# Patient Record
Sex: Male | Born: 1960 | Hispanic: No | Smoking: Never smoker
Health system: Southern US, Community
[De-identification: ages and names within clinical notes are randomized; demographics above are authoritative.]

## PROBLEM LIST (undated history)

## (undated) DIAGNOSIS — T7840XA Allergy, unspecified, initial encounter: Secondary | ICD-10-CM

## (undated) DIAGNOSIS — I10 Essential (primary) hypertension: Secondary | ICD-10-CM

## (undated) DIAGNOSIS — Z531 Procedure and treatment not carried out because of patient's decision for reasons of belief and group pressure: Secondary | ICD-10-CM

## (undated) DIAGNOSIS — IMO0001 Reserved for inherently not codable concepts without codable children: Secondary | ICD-10-CM

## (undated) HISTORY — PX: HERNIA REPAIR: SHX51

## (undated) HISTORY — DX: Allergy, unspecified, initial encounter: T78.40XA

---

## 2013-03-25 ENCOUNTER — Ambulatory Visit: Payer: Self-pay

## 2013-03-25 ENCOUNTER — Ambulatory Visit: Payer: Self-pay | Admitting: Internal Medicine

## 2013-03-25 VITALS — BP 207/139 | HR 101 | Temp 98.5°F | Resp 16 | Ht 65.5 in | Wt 174.0 lb

## 2013-03-25 DIAGNOSIS — M25519 Pain in unspecified shoulder: Secondary | ICD-10-CM

## 2013-03-25 DIAGNOSIS — M25512 Pain in left shoulder: Secondary | ICD-10-CM

## 2013-03-25 DIAGNOSIS — S40019A Contusion of unspecified shoulder, initial encounter: Secondary | ICD-10-CM

## 2013-03-25 DIAGNOSIS — S40012A Contusion of left shoulder, initial encounter: Secondary | ICD-10-CM

## 2013-03-25 DIAGNOSIS — I10 Essential (primary) hypertension: Secondary | ICD-10-CM

## 2013-03-25 DIAGNOSIS — S40029A Contusion of unspecified upper arm, initial encounter: Secondary | ICD-10-CM

## 2013-03-25 MED ORDER — AMLODIPINE BESYLATE 10 MG PO TABS: 10.0000 mg | ORAL_TABLET | Freq: Every day | ORAL | Status: AC
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
## 2013-03-25 NOTE — Patient Instructions (Addendum)
Shoulder Pain The shoulder is the joint that connects your arms to your body. The bones that form the shoulder joint include the upper arm bone (humerus), the shoulder blade (scapula), and the collarbone (clavicle). The top of the humerus is shaped like a ball and fits into a rather flat socket on the scapula (glenoid cavity). A combination of muscles and strong, fibrous tissues that connect muscles to bones (tendons) support your shoulder joint and hold the ball in the socket. Small, fluid-filled sacs (bursae) are located in different areas of the joint. They act as cushions between the bones and the overlying soft tissues and help reduce friction between the gliding tendons and the bone as you move your arm. Your shoulder joint allows a wide range of motion in your arm. This range of motion allows you to do things like scratch your back or throw a ball. However, this range of motion also makes your shoulder more prone to pain from overuse and injury. Causes of shoulder pain can originate from both injury and overuse and usually can be grouped in the following four categories:  Redness, swelling, and pain (inflammation) of the tendon (tendinitis) or the bursae (bursitis).  Instability, such as a dislocation of the joint.  Inflammation of the joint (arthritis).  Broken bone (fracture). HOME CARE INSTRUCTIONS   Apply ice to the sore area.  Put ice in a plastic bag.  Place a towel between your skin and the bag.  Leave the ice on for 15-20 minutes, 3-4 times per day for the first 2 days.  Stop using cold packs if they do not help with the pain.  If you have a shoulder sling or immobilizer, wear it as long as your caregiver instructs. Only remove it to shower or bathe. Move your arm as little as possible, but keep your hand moving to prevent swelling.  Squeeze a soft ball or foam pad as much as possible to help prevent swelling.  Only take over-the-counter or prescription medicines for pain,  discomfort, or fever as directed by your caregiver. SEEK MEDICAL CARE IF:   Your shoulder pain increases, or new pain develops in your arm, hand, or fingers.  Your hand or fingers become cold and numb.  Your pain is not relieved with medicines. SEEK IMMEDIATE MEDICAL CARE IF:   Your arm, hand, or fingers are numb or tingling.  Your arm, hand, or fingers are significantly swollen or turn white or blue. MAKE SURE YOU:   Understand these instructions.  Will watch your condition.  Will get help right away if you are not doing well or get worse. Document Released: 07/30/2005 Document Revised: 07/14/2012 Document Reviewed: 10/04/2011 [facility] Patient Information 2014 [place], [place]. Hypertension As your heart beats, it forces blood through your arteries. This force is your blood pressure. If the pressure is too high, it is called hypertension (HTN) or high blood pressure. HTN is dangerous because you may have it and not know it. High blood pressure may mean that your heart has to work harder to pump blood. Your arteries may be narrow or stiff. The extra work puts you at risk for heart disease, stroke, and other problems.  Blood pressure consists of two numbers, a higher number over a lower, 110/72, for example. It is stated as "110 over 72." The ideal is below 120 for the top number (systolic) and under 80 for the bottom (diastolic). Write down your blood pressure today. You should pay close attention to your blood pressure if you  have certain conditions such as:  Heart failure.  Prior heart attack.  Diabetes  Chronic kidney disease.  Prior stroke.  Multiple risk factors for heart disease. To see if you have HTN, your blood pressure should be measured while you are seated with your arm held at the level of the heart. It should be measured at least twice. A one-time elevated blood pressure reading (especially in the Emergency Department) does not mean that you need treatment. There  may be conditions in which the blood pressure is different between your right and left arms. It is important to see your caregiver soon for a recheck. Most people have essential hypertension which means that there is not a specific cause. This type of high blood pressure may be lowered by changing lifestyle factors such as:  Stress.  Smoking.  Lack of exercise.  Excessive weight.  Drug/tobacco/alcohol use.  Eating less salt. Most people do not have symptoms from high blood pressure until it has caused damage to the body. Effective treatment can often prevent, delay or reduce that damage. TREATMENT  When a cause has been identified, treatment for high blood pressure is directed at the cause. There are a large number of medications to treat HTN. These fall into several categories, and your caregiver will help you select the medicines that are best for you. Medications may have side effects. You should review side effects with your caregiver. If your blood pressure stays high after you have made lifestyle changes or started on medicines,   Your medication(s) may need to be changed.  Other problems may need to be addressed.  Be certain you understand your prescriptions, and know how and when to take your medicine.  Be sure to follow up with your caregiver within the time frame advised (usually within two weeks) to have your blood pressure rechecked and to review your medications.  If you are taking more than one medicine to lower your blood pressure, make sure you know how and at what times they should be taken. Taking two medicines at the same time can result in blood pressure that is too low. SEEK IMMEDIATE MEDICAL CARE IF:  You develop a severe headache, blurred or changing vision, or confusion.  You have unusual weakness or numbness, or a faint feeling.  You have severe chest or abdominal pain, vomiting, or breathing problems. MAKE SURE YOU:   Understand these  instructions.  Will watch your condition.  Will get help right away if you are not doing well or get worse. Document Released: 10/20/2005 Document Revised: 01/12/2012 Document Reviewed: 06/09/2008 [facility] Patient Information 2014 [place], [place]. DASH Diet The DASH diet stands for "Dietary Approaches to Stop Hypertension." It is a healthy eating plan that has been shown to reduce high blood pressure (hypertension) in as little as 14 days, while also possibly providing other significant health benefits. These other health benefits include reducing the risk of breast cancer after menopause and reducing the risk of type 2 diabetes, heart disease, colon cancer, and stroke. Health benefits also include weight loss and slowing kidney failure in patients with chronic kidney disease.  DIET GUIDELINES  Limit salt (sodium). Your diet should contain less than 1500 mg of sodium daily.  Limit refined or processed carbohydrates. Your diet should include mostly whole grains. Desserts and added sugars should be used sparingly.  Include small amounts of heart-healthy fats. These types of fats include nuts, oils, and tub margarine. Limit saturated and trans fats. These fats have been shown to  be harmful in the body. CHOOSING FOODS  The following food groups are based on a 2000 calorie diet. See your Registered Dietitian for individual calorie needs. Grains and Grain Products (6 to 8 servings daily)  Eat More Often: Whole-wheat bread, brown rice, whole-grain or wheat pasta, quinoa, popcorn without added fat or salt (air popped).  Eat Less Often: White bread, white pasta, white rice, cornbread. Vegetables (4 to 5 servings daily)  Eat More Often: Fresh, frozen, and canned vegetables. Vegetables may be raw, steamed, roasted, or grilled with a minimal amount of fat.  Eat Less Often/Avoid: Creamed or fried vegetables. Vegetables in a cheese sauce. Fruit (4 to 5 servings daily)  Eat More Often: All fresh,  canned (in natural juice), or frozen fruits. Dried fruits without added sugar. One hundred percent fruit juice ( cup [237 mL] daily).  Eat Less Often: Dried fruits with added sugar. Canned fruit in light or heavy syrup. [employer], Fish, and Poultry (2 servings or less daily. One serving is 3 to 4 oz [85-114 g]).  Eat More Often: Ninety percent or leaner ground beef, tenderloin, sirloin. Round cuts of beef, chicken breast, [place] breast. All fish. Grill, bake, or broil your meat. Nothing should be fried.  Eat Less Often/Avoid: Fatty cuts of meat, [place], or chicken leg, thigh, or wing. Fried cuts of meat or fish. Dairy (2 to 3 servings)  Eat More Often: Low-fat or fat-free milk, low-fat plain or light yogurt, reduced-fat or part-skim cheese.  Eat Less Often/Avoid: Milk (whole, 2%).Whole milk yogurt. Full-fat cheeses. Nuts, Seeds, and Legumes (4 to 5 servings per week)  Eat More Often: All without added salt.  Eat Less Often/Avoid: Salted nuts and seeds, canned beans with added salt. Fats and Sweets (limited)  Eat More Often: Vegetable oils, tub margarines without trans fats, sugar-free gelatin. Mayonnaise and salad dressings.  Eat Less Often/Avoid: Coconut oils, palm oils, butter, stick margarine, cream, half and half, cookies, candy, pie. FOR MORE INFORMATION The Dash Diet Eating Plan: www.dashdiet.org Document Released: 10/09/2011 Document Revised: 01/12/2012 Document Reviewed: 10/09/2011 [facility] Patient Information 2014 [place], [place].

## 2013-06-13 DIAGNOSIS — Z0271 Encounter for disability determination: Secondary | ICD-10-CM
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
## 2022-04-18 ENCOUNTER — Emergency Department (HOSPITAL_COMMUNITY): Payer: Self-pay

## 2022-04-18 ENCOUNTER — Inpatient Hospital Stay (HOSPITAL_COMMUNITY): Payer: Self-pay

## 2022-04-18 ENCOUNTER — Inpatient Hospital Stay: Payer: Self-pay

## 2022-04-18 ENCOUNTER — Encounter (HOSPITAL_COMMUNITY): Payer: Self-pay | Admitting: Neurology

## 2022-04-18 ENCOUNTER — Inpatient Hospital Stay (HOSPITAL_COMMUNITY)
Admission: EM | Admit: 2022-04-18 | Discharge: 2022-05-03 | DRG: 064 | Disposition: E | Payer: Self-pay | Attending: Neurology | Admitting: Neurology

## 2022-04-18 DIAGNOSIS — G8191 Hemiplegia, unspecified affecting right dominant side: Secondary | ICD-10-CM | POA: Diagnosis present

## 2022-04-18 DIAGNOSIS — R569 Unspecified convulsions: Secondary | ICD-10-CM

## 2022-04-18 DIAGNOSIS — J9601 Acute respiratory failure with hypoxia: Secondary | ICD-10-CM | POA: Diagnosis present

## 2022-04-18 DIAGNOSIS — I6389 Other cerebral infarction: Secondary | ICD-10-CM

## 2022-04-18 DIAGNOSIS — E785 Hyperlipidemia, unspecified: Secondary | ICD-10-CM | POA: Diagnosis present

## 2022-04-18 DIAGNOSIS — G9340 Encephalopathy, unspecified: Secondary | ICD-10-CM | POA: Diagnosis present

## 2022-04-18 DIAGNOSIS — G935 Compression of brain: Secondary | ICD-10-CM | POA: Diagnosis present

## 2022-04-18 DIAGNOSIS — I619 Nontraumatic intracerebral hemorrhage, unspecified: Secondary | ICD-10-CM | POA: Diagnosis present

## 2022-04-18 DIAGNOSIS — Z531 Procedure and treatment not carried out because of patient's decision for reasons of belief and group pressure: Secondary | ICD-10-CM | POA: Diagnosis present

## 2022-04-18 DIAGNOSIS — R29810 Facial weakness: Secondary | ICD-10-CM | POA: Diagnosis present

## 2022-04-18 DIAGNOSIS — H5347 Heteronymous bilateral field defects: Secondary | ICD-10-CM | POA: Diagnosis present

## 2022-04-18 DIAGNOSIS — I161 Hypertensive emergency: Secondary | ICD-10-CM | POA: Diagnosis present

## 2022-04-18 DIAGNOSIS — Z515 Encounter for palliative care: Secondary | ICD-10-CM

## 2022-04-18 DIAGNOSIS — Z66 Do not resuscitate: Secondary | ICD-10-CM | POA: Diagnosis not present

## 2022-04-18 DIAGNOSIS — N179 Acute kidney failure, unspecified: Secondary | ICD-10-CM | POA: Diagnosis not present

## 2022-04-18 DIAGNOSIS — Z9911 Dependence on respirator [ventilator] status: Secondary | ICD-10-CM

## 2022-04-18 DIAGNOSIS — R4701 Aphasia: Secondary | ICD-10-CM | POA: Diagnosis present

## 2022-04-18 DIAGNOSIS — G936 Cerebral edema: Secondary | ICD-10-CM | POA: Diagnosis present

## 2022-04-18 DIAGNOSIS — I10 Essential (primary) hypertension: Secondary | ICD-10-CM | POA: Diagnosis present

## 2022-04-18 DIAGNOSIS — R414 Neurologic neglect syndrome: Secondary | ICD-10-CM | POA: Diagnosis present

## 2022-04-18 DIAGNOSIS — Z8673 Personal history of transient ischemic attack (TIA), and cerebral infarction without residual deficits: Secondary | ICD-10-CM

## 2022-04-18 DIAGNOSIS — R29722 NIHSS score 22: Secondary | ICD-10-CM | POA: Diagnosis present

## 2022-04-18 DIAGNOSIS — I610 Nontraumatic intracerebral hemorrhage in hemisphere, subcortical: Secondary | ICD-10-CM | POA: Diagnosis present

## 2022-04-18 DIAGNOSIS — I615 Nontraumatic intracerebral hemorrhage, intraventricular: Principal | ICD-10-CM | POA: Diagnosis present

## 2022-04-18 DIAGNOSIS — I629 Nontraumatic intracranial hemorrhage, unspecified: Secondary | ICD-10-CM

## 2022-04-18 HISTORY — DX: Procedure and treatment not carried out because of patient's decision for reasons of belief and group pressure: Z53.1

## 2022-04-18 HISTORY — DX: Essential (primary) hypertension: I10

## 2022-04-18 HISTORY — DX: Reserved for inherently not codable concepts without codable children: IMO0001

## 2022-04-18 LAB — LIPID PANEL
Cholesterol: 201 mg/dL — ABNORMAL HIGH (ref 0–200)
HDL: 63 mg/dL (ref >40)
LDL Cholesterol: 112 mg/dL — ABNORMAL HIGH (ref 0–99)
Total CHOL/HDL Ratio: 3.2 RATIO
Triglycerides: 130 mg/dL (ref <150)
VLDL: 26 mg/dL (ref 0–40)

## 2022-04-18 LAB — DIFFERENTIAL
Abs Immature Granulocytes: 0.03 10*3/uL (ref 0.00–0.07)
Basophils Absolute: 0 10*3/uL (ref 0.0–0.1)
Basophils Relative: 0 %
Eosinophils Absolute: 0 10*3/uL (ref 0.0–0.5)
Eosinophils Relative: 0 %
Immature Granulocytes: 0 %
Lymphocytes Relative: 10 %
Lymphs Abs: 1.1 10*3/uL (ref 0.7–4.0)
Monocytes Absolute: 0.5 10*3/uL (ref 0.1–1.0)
Monocytes Relative: 5 %
Neutro Abs: 8.8 10*3/uL — ABNORMAL HIGH (ref 1.7–7.7)
Neutrophils Relative %: 85 %

## 2022-04-18 LAB — CBC
HCT: 53.2 % — ABNORMAL HIGH (ref 39.0–52.0)
Hemoglobin: 16.5 g/dL (ref 13.0–17.0)
MCH: 22.9 pg — ABNORMAL LOW (ref 26.0–34.0)
MCHC: 31 g/dL (ref 30.0–36.0)
MCV: 73.7 fL — ABNORMAL LOW (ref 80.0–100.0)
Platelets: 147 10*3/uL — ABNORMAL LOW (ref 150–400)
RBC: 7.22 MIL/uL — ABNORMAL HIGH (ref 4.22–5.81)
RDW: 16.9 % — ABNORMAL HIGH (ref 11.5–15.5)
WBC: 10.5 10*3/uL (ref 4.0–10.5)
nRBC: 0 % (ref 0.0–0.2)

## 2022-04-18 LAB — GLUCOSE, CAPILLARY
Glucose-Capillary: 132 mg/dL — ABNORMAL HIGH (ref 70–99)
Glucose-Capillary: 141 mg/dL — ABNORMAL HIGH (ref 70–99)
Glucose-Capillary: 182 mg/dL — ABNORMAL HIGH (ref 70–99)

## 2022-04-18 LAB — CBG MONITORING, ED: Glucose-Capillary: 138 mg/dL — ABNORMAL HIGH (ref 70–99)

## 2022-04-18 LAB — ECHOCARDIOGRAM COMPLETE
Area-P 1/2: 3.27 cm2
Height: 66 in
MV VTI: 3.18 cm2
P 1/2 time: 576 msec
S' Lateral: 2.5 cm
Single Plane A4C EF: 61.4 %
Weight: 2970.04 oz

## 2022-04-18 LAB — COMPREHENSIVE METABOLIC PANEL
ALT: 23 U/L (ref 0–44)
AST: 24 U/L (ref 15–41)
Albumin: 4.1 g/dL (ref 3.5–5.0)
Alkaline Phosphatase: 86 U/L (ref 38–126)
Anion gap: 16 — ABNORMAL HIGH (ref 5–15)
BUN: 12 mg/dL (ref 8–23)
CO2: 19 mmol/L — ABNORMAL LOW (ref 22–32)
Calcium: 9.5 mg/dL (ref 8.9–10.3)
Chloride: 103 mmol/L (ref 98–111)
Creatinine, Ser: 1.2 mg/dL (ref 0.61–1.24)
GFR, Estimated: >60 mL/min (ref >60)
Glucose, Bld: 130 mg/dL — ABNORMAL HIGH (ref 70–99)
Potassium: 3.5 mmol/L (ref 3.5–5.1)
Sodium: 138 mmol/L (ref 135–145)
Total Bilirubin: 0.9 mg/dL (ref 0.3–1.2)
Total Protein: 7.9 g/dL (ref 6.5–8.1)

## 2022-04-18 LAB — POCT I-STAT 7, (LYTES, BLD GAS, ICA,H+H)
Acid-Base Excess: 0 mmol/L (ref 0.0–2.0)
Bicarbonate: 25.7 mmol/L (ref 20.0–28.0)
Calcium, Ion: 1.23 mmol/L (ref 1.15–1.40)
HCT: 52 % (ref 39.0–52.0)
Hemoglobin: 17.7 g/dL — ABNORMAL HIGH (ref 13.0–17.0)
O2 Saturation: 100 %
Potassium: 3.6 mmol/L (ref 3.5–5.1)
Sodium: 142 mmol/L (ref 135–145)
TCO2: 27 mmol/L (ref 22–32)
pCO2 arterial: 46.2 mmHg (ref 32–48)
pH, Arterial: 7.353 (ref 7.35–7.45)
pO2, Arterial: 365 mmHg — ABNORMAL HIGH (ref 83–108)

## 2022-04-18 LAB — I-STAT CHEM 8, ED
BUN: 14 mg/dL (ref 8–23)
Calcium, Ion: 1.01 mmol/L — ABNORMAL LOW (ref 1.15–1.40)
Chloride: 103 mmol/L (ref 98–111)
Creatinine, Ser: 1 mg/dL (ref 0.61–1.24)
Glucose, Bld: 128 mg/dL — ABNORMAL HIGH (ref 70–99)
HCT: 55 % — ABNORMAL HIGH (ref 39.0–52.0)
Hemoglobin: 18.7 g/dL — ABNORMAL HIGH (ref 13.0–17.0)
Potassium: 3.4 mmol/L — ABNORMAL LOW (ref 3.5–5.1)
Sodium: 138 mmol/L (ref 135–145)
TCO2: 21 mmol/L — ABNORMAL LOW (ref 22–32)

## 2022-04-18 LAB — TROPONIN I (HIGH SENSITIVITY): Troponin I (High Sensitivity): 36 ng/L — ABNORMAL HIGH (ref <18)

## 2022-04-18 LAB — PROTIME-INR
INR: 1 (ref 0.8–1.2)
Prothrombin Time: 13.4 seconds (ref 11.4–15.2)

## 2022-04-18 LAB — SODIUM
Sodium: 140 mmol/L (ref 135–145)
Sodium: 141 mmol/L (ref 135–145)

## 2022-04-18 LAB — ETHANOL: Alcohol, Ethyl (B): <10 mg/dL (ref <10)

## 2022-04-18 LAB — MRSA NEXT GEN BY PCR, NASAL: MRSA by PCR Next Gen: NOT DETECTED

## 2022-04-18 LAB — APTT: aPTT: 26 seconds (ref 24–36)

## 2022-04-18 LAB — HEMOGLOBIN A1C
Hgb A1c MFr Bld: 5.8 % — ABNORMAL HIGH (ref 4.8–5.6)
Mean Plasma Glucose: 119.76 mg/dL

## 2022-04-18 LAB — LACTIC ACID, PLASMA: Lactic Acid, Venous: 1.3 mmol/L (ref 0.5–1.9)

## 2022-04-18 LAB — HIV ANTIBODY (ROUTINE TESTING W REFLEX): HIV Screen 4th Generation wRfx: NONREACTIVE

## 2022-04-18 MED ORDER — HYDRALAZINE HCL 20 MG/ML IJ SOLN: 10.0000 mg | INTRAMUSCULAR | Status: DC | PRN

## 2022-04-18 MED ORDER — MIDAZOLAM HCL 2 MG/2ML IJ SOLN: 2.0000 mg | Freq: Once | INTRAMUSCULAR | Status: AC

## 2022-04-18 MED ORDER — STROKE: EARLY STAGES OF RECOVERY BOOK
Freq: Once | Status: AC
  Filled 2022-04-18: qty 1

## 2022-04-18 MED ORDER — DOCUSATE SODIUM 50 MG/5ML PO LIQD
100.0000 mg | Freq: Two times a day (BID) | ORAL | Status: DC
  Administered 2022-04-18 – 2022-04-21 (×7): 100 mg
  Filled 2022-04-18 (×7): qty 10

## 2022-04-18 MED ORDER — CLEVIDIPINE BUTYRATE 0.5 MG/ML IV EMUL
INTRAVENOUS | Status: AC
  Administered 2022-04-18: 2 mg/h via INTRAVENOUS
  Filled 2022-04-18: qty 50

## 2022-04-18 MED ORDER — FENTANYL CITRATE PF 50 MCG/ML IJ SOSY
50.0000 ug | PREFILLED_SYRINGE | INTRAMUSCULAR | Status: DC | PRN
  Administered 2022-04-18: 100 ug via INTRAVENOUS
  Administered 2022-04-18 – 2022-04-19 (×2): 50 ug via INTRAVENOUS
  Administered 2022-04-19 (×2): 100 ug via INTRAVENOUS
  Filled 2022-04-18: qty 1
  Filled 2022-04-18 (×3): qty 2
  Filled 2022-04-18: qty 1
  Filled 2022-04-18: qty 2
  Filled 2022-04-18: qty 1

## 2022-04-18 MED ORDER — CLEVIDIPINE BUTYRATE 0.5 MG/ML IV EMUL
0.0000 mg/h | INTRAVENOUS | Status: DC
  Administered 2022-04-18: 21 mg/h via INTRAVENOUS
  Filled 2022-04-18 (×3): qty 50

## 2022-04-18 MED ORDER — ACETAMINOPHEN 650 MG RE SUPP: 650.0000 mg | RECTAL | Status: DC | PRN

## 2022-04-18 MED ORDER — PANTOPRAZOLE SODIUM 40 MG IV SOLR: 40.0000 mg | Freq: Every day | INTRAVENOUS | Status: DC

## 2022-04-18 MED ORDER — SODIUM CHLORIDE 0.9% FLUSH
10.0000 mL | Freq: Two times a day (BID) | INTRAVENOUS | Status: DC
  Administered 2022-04-19 (×2): 10 mL
  Administered 2022-04-19: 30 mL
  Administered 2022-04-20 – 2022-04-21 (×3): 10 mL

## 2022-04-18 MED ORDER — PROSOURCE TF PO LIQD
45.0000 mL | Freq: Three times a day (TID) | ORAL | Status: DC
  Administered 2022-04-19 – 2022-04-21 (×8): 45 mL
  Filled 2022-04-18 (×8): qty 45

## 2022-04-18 MED ORDER — HYDRALAZINE HCL 25 MG PO TABS
25.0000 mg | ORAL_TABLET | Freq: Four times a day (QID) | ORAL | Status: DC
Start: 2022-04-18 — End: 2022-04-19
  Administered 2022-04-18 – 2022-04-19 (×5): 25 mg
  Filled 2022-04-18 (×5): qty 1

## 2022-04-18 MED ORDER — AMLODIPINE BESYLATE 10 MG PO TABS
10.0000 mg | ORAL_TABLET | Freq: Every day | ORAL | Status: DC
Start: 2022-04-18 — End: 2022-04-21
  Administered 2022-04-18 – 2022-04-21 (×4): 10 mg
  Filled 2022-04-18 (×4): qty 1

## 2022-04-18 MED ORDER — FENTANYL CITRATE PF 50 MCG/ML IJ SOSY
50.0000 ug | PREFILLED_SYRINGE | INTRAMUSCULAR | Status: AC | PRN
  Administered 2022-04-18 (×3): 50 ug via INTRAVENOUS
  Filled 2022-04-18 (×3): qty 1

## 2022-04-18 MED ORDER — FENTANYL CITRATE PF 50 MCG/ML IJ SOSY
100.0000 ug | PREFILLED_SYRINGE | Freq: Once | INTRAMUSCULAR | Status: AC
  Administered 2022-04-18: 100 ug via INTRAVENOUS
  Filled 2022-04-18: qty 2

## 2022-04-18 MED ORDER — PROPOFOL 1000 MG/100ML IV EMUL
INTRAVENOUS | Status: AC
  Filled 2022-04-18: qty 100

## 2022-04-18 MED ORDER — CHLORHEXIDINE GLUCONATE CLOTH 2 % EX PADS
6.0000 | MEDICATED_PAD | Freq: Every day | CUTANEOUS | Status: DC
  Administered 2022-04-18 – 2022-04-21 (×3): 6 via TOPICAL

## 2022-04-18 MED ORDER — MIDAZOLAM HCL 2 MG/2ML IJ SOLN
2.0000 mg | INTRAMUSCULAR | Status: DC | PRN
  Administered 2022-04-19 – 2022-04-20 (×2): 2 mg via INTRAVENOUS
  Filled 2022-04-18 (×2): qty 2

## 2022-04-18 MED ORDER — PROPOFOL 1000 MG/100ML IV EMUL
0.0000 ug/kg/min | INTRAVENOUS | Status: DC
  Administered 2022-04-18: 30 ug/kg/min via INTRAVENOUS
  Administered 2022-04-18: 10 ug/kg/min via INTRAVENOUS
  Administered 2022-04-19 (×2): 50 ug/kg/min via INTRAVENOUS
  Administered 2022-04-19: 40 ug/kg/min via INTRAVENOUS
  Administered 2022-04-19 – 2022-04-20 (×4): 50 ug/kg/min via INTRAVENOUS
  Administered 2022-04-20: 10 ug/kg/min via INTRAVENOUS
  Administered 2022-04-20 – 2022-04-21 (×4): 50 ug/kg/min via INTRAVENOUS
  Filled 2022-04-18 (×14): qty 100

## 2022-04-18 MED ORDER — SODIUM CHLORIDE 3 % IV BOLUS
250.0000 mL | Freq: Once | INTRAVENOUS | Status: AC
Start: 2022-04-18 — End: 2022-04-18
  Administered 2022-04-18: 250 mL via INTRAVENOUS
  Filled 2022-04-18: qty 500

## 2022-04-18 MED ORDER — CLEVIDIPINE BUTYRATE 0.5 MG/ML IV EMUL
0.0000 mg/h | INTRAVENOUS | Status: DC
  Administered 2022-04-18: 21 mg/h via INTRAVENOUS
  Administered 2022-04-18: 19 mg/h via INTRAVENOUS
  Administered 2022-04-18: 14 mg/h via INTRAVENOUS
  Administered 2022-04-18 (×2): 12 mg/h via INTRAVENOUS
  Administered 2022-04-19: 14 mg/h via INTRAVENOUS
  Administered 2022-04-19: 15 mg/h via INTRAVENOUS
  Administered 2022-04-19: 14 mg/h via INTRAVENOUS
  Administered 2022-04-19: 16 mg/h via INTRAVENOUS
  Administered 2022-04-20 (×2): 21 mg/h via INTRAVENOUS
  Administered 2022-04-20: 20 mg/h via INTRAVENOUS
  Administered 2022-04-20: 15 mg/h via INTRAVENOUS
  Administered 2022-04-20: 20 mg/h via INTRAVENOUS
  Administered 2022-04-20: 21 mg/h via INTRAVENOUS
  Administered 2022-04-20: 18 mg/h via INTRAVENOUS
  Administered 2022-04-21 (×2): 21 mg/h via INTRAVENOUS
  Administered 2022-04-21: 17 mg/h via INTRAVENOUS
  Filled 2022-04-18 (×4): qty 100
  Filled 2022-04-18: qty 200
  Filled 2022-04-18 (×2): qty 100
  Filled 2022-04-18: qty 200
  Filled 2022-04-18 (×12): qty 100

## 2022-04-18 MED ORDER — LEVETIRACETAM IN NACL 1000 MG/100ML IV SOLN
1000.0000 mg | Freq: Once | INTRAVENOUS | Status: AC
  Administered 2022-04-18: 1000 mg via INTRAVENOUS
  Filled 2022-04-18: qty 100

## 2022-04-18 MED ORDER — ACETAMINOPHEN 325 MG PO TABS: 650.0000 mg | ORAL_TABLET | ORAL | Status: DC | PRN

## 2022-04-18 MED ORDER — FENTANYL CITRATE PF 50 MCG/ML IJ SOSY
PREFILLED_SYRINGE | INTRAMUSCULAR | Status: AC
  Filled 2022-04-18: qty 2

## 2022-04-18 MED ORDER — MIDAZOLAM HCL 2 MG/2ML IJ SOLN
INTRAMUSCULAR | Status: AC
  Administered 2022-04-18: 2 mg via INTRAVENOUS
  Filled 2022-04-18: qty 4

## 2022-04-18 MED ORDER — SODIUM CHLORIDE 0.9% FLUSH: 10.0000 mL | INTRAVENOUS | Status: DC | PRN

## 2022-04-18 MED ORDER — LEVETIRACETAM IN NACL 500 MG/100ML IV SOLN
500.0000 mg | Freq: Two times a day (BID) | INTRAVENOUS | Status: DC
  Administered 2022-04-19 – 2022-04-21 (×5): 500 mg via INTRAVENOUS
  Filled 2022-04-18 (×5): qty 100

## 2022-04-18 MED ORDER — ORAL CARE MOUTH RINSE
15.0000 mL | OROMUCOSAL | Status: DC
  Administered 2022-04-18 – 2022-04-21 (×33): 15 mL via OROMUCOSAL

## 2022-04-18 MED ORDER — ROCURONIUM BROMIDE 50 MG/5ML IV SOLN
100.0000 mg | Freq: Once | INTRAVENOUS | Status: AC
  Filled 2022-04-18: qty 10

## 2022-04-18 MED ORDER — CALCIUM GLUCONATE-NACL 1-0.675 GM/50ML-% IV SOLN
1.0000 g | Freq: Once | INTRAVENOUS | Status: AC
  Administered 2022-04-18: 1000 mg via INTRAVENOUS
  Filled 2022-04-18: qty 50

## 2022-04-18 MED ORDER — LABETALOL HCL 5 MG/ML IV SOLN
10.0000 mg | INTRAVENOUS | Status: DC | PRN
  Administered 2022-04-18 – 2022-04-19 (×2): 10 mg via INTRAVENOUS
  Filled 2022-04-18 (×2): qty 4

## 2022-04-18 MED ORDER — LABETALOL HCL 5 MG/ML IV SOLN
20.0000 mg | Freq: Once | INTRAVENOUS | Status: AC
  Administered 2022-04-18: 20 mg via INTRAVENOUS

## 2022-04-18 MED ORDER — POTASSIUM CHLORIDE 20 MEQ PO PACK
40.0000 meq | PACK | Freq: Once | ORAL | Status: AC
Start: 2022-04-18 — End: 2022-04-18
  Administered 2022-04-18: 40 meq
  Filled 2022-04-18: qty 2

## 2022-04-18 MED ORDER — ROCURONIUM BROMIDE 10 MG/ML (PF) SYRINGE
PREFILLED_SYRINGE | INTRAVENOUS | Status: AC
  Administered 2022-04-18: 100 mg via INTRAVENOUS
  Filled 2022-04-18: qty 10

## 2022-04-18 MED ORDER — SODIUM CHLORIDE 3 % IV SOLN
INTRAVENOUS | Status: DC
  Administered 2022-04-18 (×2): 75 mL/h via INTRAVENOUS
  Filled 2022-04-18 (×7): qty 500

## 2022-04-18 MED ORDER — PANTOPRAZOLE 2 MG/ML SUSPENSION
40.0000 mg | Freq: Every day | ORAL | Status: DC
Start: 2022-04-18 — End: 2022-04-21
  Administered 2022-04-18 – 2022-04-21 (×4): 40 mg
  Filled 2022-04-18 (×4): qty 20

## 2022-04-18 MED ORDER — ORAL CARE MOUTH RINSE
15.0000 mL | OROMUCOSAL | Status: DC | PRN
Start: 2022-04-18 — End: 2022-04-21

## 2022-04-18 MED ORDER — PROSOURCE TF PO LIQD
45.0000 mL | Freq: Two times a day (BID) | ORAL | Status: DC
  Administered 2022-04-18: 45 mL
  Filled 2022-04-18: qty 45

## 2022-04-18 MED ORDER — SENNOSIDES-DOCUSATE SODIUM 8.6-50 MG PO TABS
1.0000 | ORAL_TABLET | Freq: Two times a day (BID) | ORAL | Status: DC
  Administered 2022-04-19: 1 via ORAL
  Filled 2022-04-18 (×2): qty 1

## 2022-04-18 MED ORDER — POLYETHYLENE GLYCOL 3350 17 G PO PACK
17.0000 g | PACK | Freq: Every day | ORAL | Status: DC
  Administered 2022-04-18 – 2022-04-21 (×4): 17 g
  Filled 2022-04-18 (×4): qty 1

## 2022-04-18 MED ORDER — ACETAMINOPHEN 160 MG/5ML PO SOLN
650.0000 mg | ORAL | Status: DC | PRN
  Administered 2022-04-20 – 2022-04-21 (×6): 650 mg
  Filled 2022-04-18 (×6): qty 20.3

## 2022-04-18 MED ORDER — INSULIN ASPART 100 UNIT/ML IJ SOLN
1.0000 [IU] | INTRAMUSCULAR | Status: DC
  Administered 2022-04-18 (×2): 1 [IU] via SUBCUTANEOUS
  Administered 2022-04-19 (×3): 2 [IU] via SUBCUTANEOUS
  Administered 2022-04-19: 1 [IU] via SUBCUTANEOUS
  Administered 2022-04-19 – 2022-04-20 (×3): 2 [IU] via SUBCUTANEOUS
  Administered 2022-04-20 (×2): 1 [IU] via SUBCUTANEOUS
  Administered 2022-04-20 – 2022-04-21 (×4): 2 [IU] via SUBCUTANEOUS
  Administered 2022-04-21: 1 [IU] via SUBCUTANEOUS
  Administered 2022-04-21 (×2): 2 [IU] via SUBCUTANEOUS

## 2022-04-18 MED ORDER — VITAL HIGH PROTEIN PO LIQD: 1000.0000 mL | ORAL | Status: DC

## 2022-04-18 MED ORDER — OSMOLITE 1.5 CAL PO LIQD
1000.0000 mL | ORAL | Status: DC
  Administered 2022-04-18 – 2022-04-21 (×4): 1000 mL

## 2022-04-18 MED ORDER — FENTANYL CITRATE PF 50 MCG/ML IJ SOSY
200.0000 ug | PREFILLED_SYRINGE | Freq: Once | INTRAMUSCULAR | Status: AC
  Administered 2022-04-18: 200 ug via INTRAVENOUS

## 2022-04-18 MED ORDER — IOHEXOL 350 MG/ML SOLN
60.0000 mL | Freq: Once | INTRAVENOUS | Status: AC | PRN
  Administered 2022-04-18: 60 mL via INTRAVENOUS

## 2022-04-18 NOTE — Progress Notes (Addendum)
Patient is starting LTM spoke with Dr. Thomasena Edis will hold off on MRI and CT until 6/17

## 2022-04-18 NOTE — Procedures (Signed)
Patient Name: Aaron Fitzgerald  MRN: [id number]  Epilepsy Attending: Charlsie Quest  Referring Physician/Provider: Elmer Picker, NP  Date: 04/25/2022 Duration: 24.54 mins  Patient history:  61 y.o. 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 EEG to evaluate for seizure  Level of alertness: lethargic   AEDs during EEG study: LEV, propofol  Technical aspects: This EEG study was done with scalp electrodes positioned according to the 10-20 International system of electrode placement. Electrical activity was acquired at a sampling rate of 500Hz  and reviewed with a high frequency filter of 70Hz  and a low frequency filter of 1Hz . EEG data were recorded continuously and digitally stored.   Description: EEG showed continuous generalized 3 to 6 Hz theta-delta slowing admixed with an excessive amount of 15 to 18 Hz beta activity distributed symmetrically and diffusely. Hyperventilation and photic stimulation were not performed.     ABNORMALITY - Continuous slow, generalized  IMPRESSION: This study is suggestive of moderate to severe diffuse encephalopathy, nonspecific etiology but likely related to sedation. No seizures or epileptiform discharges were seen throughout the recording.  [name] 

## 2022-04-18 NOTE — Procedures (Signed)
Arterial Catheter Insertion Procedure Note  Aaron Fitzgerald  [id number]  Nov 12, 1960  Date:05/13/2022  Time:3:53 PM    Provider Performing: Tobey Grim    Procedure: Insertion of Arterial Line (35361) with US guidance (44315)   Indication(s) Blood pressure monitoring and/or need for frequent ABGs  Consent Unable to obtain consent due to inability to find a medical decision maker for patient.  All reasonable efforts were made.  Another independent medical provider, Dr. Chestine Spore , confirmed the benefits of this procedure outweigh the risks.  Anesthesia None   Time Out Verified patient identification, verified procedure, site/side was marked, verified correct patient position, special equipment/implants available, medications/allergies/relevant history reviewed, required imaging and test results available.   Sterile Technique Maximal sterile technique including full sterile barrier drape, hand hygiene, sterile gown, sterile gloves, mask, hair covering, sterile ultrasound probe cover (if used).   Procedure Description Area of catheter insertion was cleaned with chlorhexidine and draped in sterile fashion. With real-time ultrasound guidance an arterial catheter was placed into the right radial artery.  Appropriate arterial tracings confirmed on monitor.     Complications/Tolerance None; patient tolerated the procedure well.   EBL Minimal   Specimen(s) None

## 2022-04-18 NOTE — Progress Notes (Signed)
EEG LTM hooked up after STAT routine. Patient had no skin breakdown. They are NOT MRI compatible leads. [facility] is monitoring.

## 2022-04-18 NOTE — Progress Notes (Signed)
Consent for PICC signed by Aaron Fitzgerald.

## 2022-04-18 NOTE — Progress Notes (Signed)
Patient seen and examined at bedside. Intermittently more alert, mostly gurgling oral secretions. Had apneas in the ED. Neglect R side, facial twitching on the left. Dense RUE paresis, moving RLE some but mostly with left leg. Briskly moving spontaneously on the left.  Friends at bedside who do not know much about his medical history. He had his healthcare directives in his wallet--photo below. They have confirmed he is Jehova's witness and does not want whole blood or the 4 main blood components, but are unsure about other things like albumin.  His primary POA is unavailable but the surrogate person is on the way from [facility]. Discussed plan to proceed with intubation for airway protection. On hypertonic saline. Starting keppra & EEG ordered. NS consulted. Neurology remains at bedside.       Steffanie Dunn, DO 04/27/2022 1:24 PM [facility]

## 2022-04-18 NOTE — Consult Note (Signed)
Reason for Consult: Cerebral hemorrhage Referring Physician: Stroke team  Aaron Fitzgerald is an 61 y.o. male.  HPI: Patient is a 61 year old male who apparently was found in his vehicle this morning.  He is noted to be nonverbal and confused.  He was brought to the emergency room where a CT scan of the head demonstrates presence of a left basal ganglia hemorrhage.  He initially was stable but hemiplegic on the right he has been nonverbal.  His level of consciousness has deteriorated and the patient is having a difficult time maintaining his airway he is about to be intubated for airway control.  I was consulted by the stroke team for evaluation of this process.  The patient's CT scan shows that he has some moderate shift the hemorrhage itself measures 45 mm in maximum dimension there is no ventricular penetration.  The ventricles are small at this time.  Past Medical History:  Diagnosis Date   Allergy    HTN (hypertension)    Refusal of blood transfusions as patient is Jehovah's Witness     Past Surgical History:  Procedure Laterality Date   HERNIA REPAIR      No family history on file.  Social History:  reports that he has never smoked. He does not have any smokeless tobacco history on file. He reports current alcohol use. He reports that he does not use drugs.  Allergies:  Allergies  Allergen Reactions   Plasma, Human     Jehova's witness   Red Blood Cells     Jehova's witness   Whole Blood     Jehova's witness    Medications: I have reviewed the patient's current medications.  Results for orders placed or performed during the hospital encounter of 04/06/2022 (from the past 48 hour(s))  Ethanol     Status: None   Collection Time: 04/08/2022 10:40 AM  Result Value Ref Range   Alcohol, Ethyl (B) <10 <10 mg/dL    Comment: (NOTE) Lowest detectable limit for serum alcohol is 10 mg/dL.  For medical purposes only. Performed at [facility], [address] [place],  [place] [address]   Protime-INR     Status: None   Collection Time: 04/23/2022 10:40 AM  Result Value Ref Range   Prothrombin Time 13.4 11.4 - 15.2 seconds   INR 1.0 0.8 - 1.2    Comment: (NOTE) INR goal varies based on device and disease states. Performed at [facility], [address] [place], [place] [address]   APTT     Status: None   Collection Time: 04/07/2022 10:40 AM  Result Value Ref Range   aPTT 26 24 - 36 seconds    Comment: Performed at [facility], [address], [place], [place] [address]  CBC     Status: Abnormal   Collection Time: 04/12/2022 10:40 AM  Result Value Ref Range   WBC 10.5 4.0 - 10.5 K/uL   RBC 7.22 (H) 4.22 - 5.81 MIL/uL   Hemoglobin 16.5 13.0 - 17.0 g/dL   HCT 53.6 (H) 14.4 - 31.5 %   MCV 73.7 (L) 80.0 - 100.0 fL   MCH 22.9 (L) 26.0 - 34.0 pg   MCHC 31.0 30.0 - 36.0 g/dL   RDW 40.0 (H) 86.7 - 61.9 %   Platelets 147 (L) 150 - 400 K/uL    Comment: REPEATED TO VERIFY   nRBC 0.0 0.0 - 0.2 %    Comment: Performed at [facility], [address] [place], [place] [address]  Differential     Status: Abnormal   Collection Time: 04/29/2022 10:40 AM  Result Value Ref Range   Neutrophils Relative % 85 %   Neutro Abs 8.8 (H) 1.7 - 7.7 K/uL   Lymphocytes Relative 10 %   Lymphs Abs 1.1 0.7 - 4.0 K/uL   Monocytes Relative 5 %   Monocytes Absolute 0.5 0.1 - 1.0 K/uL   Eosinophils Relative 0 %   Eosinophils Absolute 0.0 0.0 - 0.5 K/uL   Basophils Relative 0 %   Basophils Absolute 0.0 0.0 - 0.1 K/uL   Immature Granulocytes 0 %   Abs Immature Granulocytes 0.03 0.00 - 0.07 K/uL    Comment: Performed at [facility], [address] [place], [place] [address]  Comprehensive metabolic panel     Status: Abnormal   Collection Time: 04/12/2022 10:40 AM  Result Value Ref Range   Sodium 138 135 - 145 mmol/L   Potassium 3.5 3.5 - 5.1 mmol/L   Chloride 103 98 - 111 mmol/L   CO2 19 (L) 22 - 32 mmol/L   Glucose, Bld 130 (H) 70 - 99 mg/dL    Comment:  Glucose reference range applies only to samples taken after fasting for at least 8 hours.   BUN 12 8 - 23 mg/dL   Creatinine, Ser 4.03 0.61 - 1.24 mg/dL   Calcium 9.5 8.9 - 52.4 mg/dL   Total Protein 7.9 6.5 - 8.1 g/dL   Albumin 4.1 3.5 - 5.0 g/dL   AST 24 15 - 41 U/L   ALT 23 0 - 44 U/L   Alkaline Phosphatase 86 38 - 126 U/L   Total Bilirubin 0.9 0.3 - 1.2 mg/dL   GFR, Estimated >81 >85 mL/min    Comment: (NOTE) Calculated using the CKD-EPI Creatinine Equation (2021)    Anion gap 16 (H) 5 - 15    Comment: Performed at [facility], [address], [place], [place] [address]  CBG monitoring, ED     Status: Abnormal   Collection Time: 05/01/2022 10:42 AM  Result Value Ref Range   Glucose-Capillary 138 (H) 70 - 99 mg/dL    Comment: Glucose reference range applies only to samples taken after fasting for at least 8 hours.  I-stat chem 8, ED     Status: Abnormal   Collection Time: 05/01/2022 10:47 AM  Result Value Ref Range   Sodium 138 135 - 145 mmol/L   Potassium 3.4 (L) 3.5 - 5.1 mmol/L   Chloride 103 98 - 111 mmol/L   BUN 14 8 - 23 mg/dL   Creatinine, Ser 1.21 0.61 - 1.24 mg/dL   Glucose, Bld 624 (H) 70 - 99 mg/dL    Comment: Glucose reference range applies only to samples taken after fasting for at least 8 hours.   Calcium, Ion 1.01 (L) 1.15 - 1.40 mmol/L   TCO2 21 (L) 22 - 32 mmol/L   Hemoglobin 18.7 (H) 13.0 - 17.0 g/dL   HCT 46.9 (H) 50.7 - 22.5 %    CT ANGIO HEAD NECK W WO CM W PERF (CODE STROKE)  Result Date: 05/01/2022 CLINICAL DATA:  Neuro deficit, acute, stroke suspected right face and side weakness and mute EXAM: CT ANGIOGRAPHY HEAD AND NECK TECHNIQUE: Multidetector CT imaging of the head and neck was performed using the standard protocol during bolus administration of intravenous contrast. Multiplanar CT image reconstructions and MIPs were obtained to evaluate the vascular anatomy. Carotid stenosis measurements (  when applicable) are obtained utilizing NASCET  criteria, using the distal internal carotid diameter as the denominator. RADIATION DOSE REDUCTION: This exam was performed according to the departmental dose-optimization program which includes automated exposure control, adjustment of the mA and/or kV according to patient size and/or use of iterative reconstruction technique. CONTRAST:  46mL OMNIPAQUE IOHEXOL 350 MG/ML SOLN COMPARISON:  None Available. FINDINGS: CTA NECK Aortic arch: Great vessel origins are patent. Right carotid system: Patent. Minimal calcified plaque at the ICA origin. No stenosis. Left carotid system: Patent.  No stenosis. Vertebral arteries: Patent. The left V1 vertebral artery is partially obscured by artifact. Right vertebral is dominant. Skeleton: No acute osseous abnormality. Other neck: Unremarkable. Upper chest: No apical lung mass. Review of the MIP images confirms the above findings CTA HEAD Anterior circulation: Intracranial internal carotid arteries are patent. Anterior cerebral arteries are patent. 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 Middle cerebral arteries are patent. Irregularity of distal ACA and MCA branches with areas of up to moderate stenosis. Posterior circulation: Intracranial vertebral arteries are patent. The left vertebral is diminutive after PICA origin. Basilar artery is patent. Major cerebellar artery origins are patent. Bilateral posterior communicating arteries are present. Posterior cerebral arteries are patent. Irregularity more distal PCA branches. Venous sinuses: Patent as allowed by contrast bolus timing. Review of the MIP images confirms the above findings IMPRESSION: No large vessel occlusion. Medium and small vessel irregularity involving both anterior and posterior circulations. Considerations include atherosclerosis or other vasculopathy/vasculitis. There is no aneurysm or AVM. Electronically Signed   By: Guadlupe Spanish M.D.   On: 04/27/2022 11:15   CT HEAD CODE STROKE WO  CONTRAST  Result Date: April 21, 2022 CLINICAL DATA:  Code stroke. Neuro deficit, acute, stroke suspected right face and side weakness and mute EXAM: CT HEAD WITHOUT CONTRAST TECHNIQUE: Contiguous axial images were obtained from the base of the skull through the vertex without intravenous contrast. RADIATION DOSE REDUCTION: This exam was performed according to the departmental dose-optimization program which includes automated exposure control, adjustment of the mA and/or kV according to patient size and/or use of iterative reconstruction technique. COMPARISON:  None Available. FINDINGS: Brain: There is acute parenchymal hemorrhage involving the left frontal lobe subcortical and deep white matter above the level of the basal ganglia. There is also involvement of the left lentiform nucleus and subinsular white matter. Hemorrhage extends toward but does not definitely involve the left lateral ventricle. Surrounding edema is present with regional mass effect including partial effacement of the left lateral ventricle. Confluent areas of low-density in the supratentorial white matter nonspecific but may reflect advanced chronic microvascular ischemic changes. There is no hydrocephalus or extra-axial collection. Vascular: No hyperdense vessel. Skull: Unremarkable. Sinuses/Orbits: No acute abnormality. Other: Mastoid air cells are clear. IMPRESSION: Acute left frontal parenchymal hemorrhage involving basal ganglia and surrounding white matter. Associated edema with mild mass effect. No definite intraventricular extension at this time. Advanced chronic microvascular ischemic changes. Preliminary results were communicated to Dr. Common at 10:52 am on 21-Apr-2022 by text page via the [facility] messaging system. Electronically Signed   By: Guadlupe Spanish M.D.   On: 04/25/2022 10:57    Review of Systems  Reason unable to perform ROS: Acuity of situation.   Blood pressure (!) 151/103, pulse 85, temperature 98.7 F (37.1 C),  temperature source Oral, resp. rate (!) 23, height 5\' 6"  (1.676 m), weight 85 kg, SpO2 95 %. Physical Exam Constitutional:      Appearance: Normal appearance.  Comments: Arouses to painful stimulation opens both eyes which are everted to the left side.  Pupils are 2 mm and briskly reactive.  He has right-sided weakness of the face in addition to right upper extremity plegia right lower extremity plegia is noted.  He moves the left upper extremity purposefully.  He is nonverbal.  He does not follow commands.  HENT:     Head: Normocephalic and atraumatic.  Neurological:     Comments: See constitutional exam     Assessment/Plan: Moderate size left basal ganglia hemorrhage with mass effect and shift.  Patient is currently being intubated  We will observe him clinically.  Follow-up CT scan in 6 hours.  If condition is worsening he may benefit from surgical decompression of the hemorrhage.  Shary Key Kazuki Ingle 04/11/2022, 1:44 PM

## 2022-04-18 NOTE — ED Provider Notes (Addendum)
[facility] EMERGENCY DEPARTMENT Provider Note   CSN: [id number] Arrival date & time: 12-May-2022  1037     History  No chief complaint on file.   Joshia Kitchings is a 61 y.o. male.  Patient is a 61 year old male with no significant medical history except for possibly hypertension who is presenting today with EMS as a code stroke.  All of history was provided by EMS and a bystander who called them today.  Patiently apparently is Jehovah's Witness and was at a church event last night around 930 he seemed his normal self.  He was happy talking without any acute findings.  Today someone showed up at the church and patient was sitting in his car.  At that time he was unable to answer any questions but did appear to be awake.  It is unclear when his symptoms started but EMS reported LVO positive with right-sided hemineglect, right-sided paralysis and patient was not following commands well.  He was noted to be hypertensive and tachycardic with a normal blood sugar.  No further history is available at this time.  Last seen normal is 9:30 PM.  The history is provided by the EMS personnel. The history is limited by the condition of the patient.       Home Medications Prior to Admission medications   Medication Sig Start Date End Date Taking? Authorizing Provider  amLODipine (NORVASC) 10 MG tablet Take 1 tablet (10 mg total) by mouth daily. 03/25/13   Jonita Albee, MD      Allergies    Patient has no known allergies.    Review of Systems   Review of Systems  Physical Exam Updated Vital Signs BP (!) 154/103   Pulse 80   Temp (!) 97.1 F (36.2 C) (Oral)   Resp 17   Wt 85 kg   SpO2 97%   BMI 30.71 kg/m  Physical Exam Vitals and nursing note reviewed.  Constitutional:      General: He is not in acute distress.    Appearance: He is well-developed.  HENT:     Head: Normocephalic and atraumatic.     Mouth/Throat:     Comments: Drooling but able to open mouth  widely and no notable swelling Eyes:     Conjunctiva/sclera: Conjunctivae normal.     Pupils: Pupils are equal, round, and reactive to light.  Cardiovascular:     Rate and Rhythm: Normal rate and regular rhythm.     Heart sounds: No murmur heard. Pulmonary:     Effort: Pulmonary effort is normal. No respiratory distress.     Breath sounds: Normal breath sounds. No wheezing or rales.  Abdominal:     General: There is no distension.     Palpations: Abdomen is soft.     Tenderness: There is no abdominal tenderness. There is no guarding or rebound.  Musculoskeletal:        General: No tenderness. Normal range of motion.     Cervical back: Normal range of motion and neck supple.  Skin:    General: Skin is warm and dry.     Findings: No erythema or rash.  Neurological:     Mental Status: He is alert.     Cranial Nerves: Facial asymmetry present.     Comments: Patient appears to have a right-sided facial droop, 0 out of 5 strength in the right upper and lower extremity.  Right-sided hemineglect.  Only able to follow commands occasionally     ED  Results / Procedures / Treatments   Labs (all labs ordered are listed, but only abnormal results are displayed) Labs Reviewed  CBC - Abnormal; Notable for the following components:      Result Value   RBC 7.22 (*)    HCT 53.2 (*)    MCV 73.7 (*)    MCH 22.9 (*)    RDW 16.9 (*)    Platelets 147 (*)    All other components within normal limits  DIFFERENTIAL - Abnormal; Notable for the following components:   Neutro Abs 8.8 (*)    All other components within normal limits  COMPREHENSIVE METABOLIC PANEL - Abnormal; Notable for the following components:   CO2 19 (*)    Glucose, Bld 130 (*)    Anion gap 16 (*)    All other components within normal limits  CBG MONITORING, ED - Abnormal; Notable for the following components:   Glucose-Capillary 138 (*)    All other components within normal limits  I-STAT CHEM 8, ED - Abnormal; Notable for  the following components:   Potassium 3.4 (*)    Glucose, Bld 128 (*)    Calcium, Ion 1.01 (*)    TCO2 21 (*)    Hemoglobin 18.7 (*)    HCT 55.0 (*)    All other components within normal limits  RESP PANEL BY RT-PCR (FLU A&B, COVID) ARPGX2  ETHANOL  PROTIME-INR  APTT  RAPID URINE DRUG SCREEN, HOSP PERFORMED  URINALYSIS, ROUTINE W REFLEX MICROSCOPIC  HIV ANTIBODY (ROUTINE TESTING W REFLEX)    EKG EKG Interpretation  Date/Time:  Friday May 11, 2022 11:44:06 EDT Ventricular Rate:  79 PR Interval:  207 QRS Duration: 92 QT Interval:  447 QTC Calculation: 513 R Axis:   22 Text Interpretation: Sinus rhythm Probable left atrial enlargement Probable LVH with secondary repol abnrm Prolonged QT interval No previous tracing Confirmed by Gwyneth Sprout ([id number]) on May 11, 2022 11:54:59 AM  Radiology CT ANGIO HEAD NECK W WO CM W PERF (CODE STROKE)  Result Date: 11-May-2022 CLINICAL DATA:  Neuro deficit, acute, stroke suspected right face and side weakness and mute EXAM: CT ANGIOGRAPHY HEAD AND NECK TECHNIQUE: Multidetector CT imaging of the head and neck was performed using the standard protocol during bolus administration of intravenous contrast. Multiplanar CT image reconstructions and MIPs were obtained to evaluate the vascular anatomy. Carotid stenosis measurements (when applicable) are obtained utilizing NASCET criteria, using the distal internal carotid diameter as the denominator. RADIATION DOSE REDUCTION: This exam was performed according to the departmental dose-optimization program which includes automated exposure control, adjustment of the mA and/or kV according to patient size and/or use of iterative reconstruction technique. CONTRAST:  80mL OMNIPAQUE IOHEXOL 350 MG/ML SOLN COMPARISON:  None Available. FINDINGS: CTA NECK Aortic arch: Great vessel origins are patent. Right carotid system: Patent. Minimal calcified plaque at the ICA origin. No stenosis. Left carotid system: Patent.  No  stenosis. Vertebral arteries: Patent. The left V1 vertebral artery is partially obscured by artifact. Right vertebral is dominant. Skeleton: No acute osseous abnormality. Other neck: Unremarkable. Upper chest: No apical lung mass. Review of the MIP images confirms the above findings CTA HEAD Anterior circulation: Intracranial internal carotid arteries are patent. Anterior cerebral arteries are patent. 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 Middle cerebral arteries are patent. Irregularity of distal ACA and MCA branches with areas of up to moderate stenosis. Posterior circulation: Intracranial vertebral arteries are patent. The left vertebral is diminutive after PICA origin. Basilar artery  is patent. Major cerebellar artery origins are patent. Bilateral posterior communicating arteries are present. Posterior cerebral arteries are patent. Irregularity more distal PCA branches. Venous sinuses: Patent as allowed by contrast bolus timing. Review of the MIP images confirms the above findings IMPRESSION: No large vessel occlusion. Medium and small vessel irregularity involving both anterior and posterior circulations. Considerations include atherosclerosis or other vasculopathy/vasculitis. There is no aneurysm or AVM. Electronically Signed   By: Guadlupe Spanish M.D.   On: 04-20-2022 11:15   CT HEAD CODE STROKE WO CONTRAST  Result Date: Apr 20, 2022 CLINICAL DATA:  Code stroke. Neuro deficit, acute, stroke suspected right face and side weakness and mute EXAM: CT HEAD WITHOUT CONTRAST TECHNIQUE: Contiguous axial images were obtained from the base of the skull through the vertex without intravenous contrast. RADIATION DOSE REDUCTION: This exam was performed according to the departmental dose-optimization program which includes automated exposure control, adjustment of the mA and/or kV according to patient size and/or use of iterative reconstruction technique. COMPARISON:  None Available. FINDINGS:  Brain: There is acute parenchymal hemorrhage involving the left frontal lobe subcortical and deep white matter above the level of the basal ganglia. There is also involvement of the left lentiform nucleus and subinsular white matter. Hemorrhage extends toward but does not definitely involve the left lateral ventricle. Surrounding edema is present with regional mass effect including partial effacement of the left lateral ventricle. Confluent areas of low-density in the supratentorial white matter nonspecific but may reflect advanced chronic microvascular ischemic changes. There is no hydrocephalus or extra-axial collection. Vascular: No hyperdense vessel. Skull: Unremarkable. Sinuses/Orbits: No acute abnormality. Other: Mastoid air cells are clear. IMPRESSION: Acute left frontal parenchymal hemorrhage involving basal ganglia and surrounding white matter. Associated edema with mild mass effect. No definite intraventricular extension at this time. Advanced chronic microvascular ischemic changes. Preliminary results were communicated to Dr. Common at 10:52 am on April 20, 2022 by text page via the [facility] messaging system. Electronically Signed   By: Guadlupe Spanish M.D.   On: 2022-04-20 10:57    Procedures Procedures    Medications Ordered in ED Medications  clevidipine (CLEVIPREX) infusion 0.5 mg/mL (8 mg/hr Intravenous Infusion Verify 20-Apr-2022 1109)  sodium chloride 3% (hypertonic) IV bolus 250 mL (250 mLs Intravenous New Bag/Given 04/20/22 1130)  sodium chloride (hypertonic) 3 % solution (has no administration in time range)   stroke: early stages of recovery book (has no administration in time range)  acetaminophen (TYLENOL) tablet 650 mg (has no administration in time range)    Or  acetaminophen (TYLENOL) 160 MG/5ML solution 650 mg (has no administration in time range)    Or  acetaminophen (TYLENOL) suppository 650 mg (has no administration in time range)  senna-docusate (Senokot-S) tablet 1 tablet (has no  administration in time range)  pantoprazole (PROTONIX) injection 40 mg (has no administration in time range)  labetalol (NORMODYNE) injection 10 mg (has no administration in time range)  labetalol (NORMODYNE) injection 20 mg (20 mg Intravenous Given 04-20-22 1050)  iohexol (OMNIPAQUE) 350 MG/ML injection 60 mL (60 mLs Intravenous Contrast Given April 20, 2022 1101)    ED Course/ Medical Decision Making/ A&P                           Medical Decision Making Amount and/or Complexity of Data Reviewed Independent Historian: EMS Labs: ordered. Decision-making details documented in ED Course. Radiology: ordered and independent interpretation performed. Decision-making details documented in ED Course. ECG/medicine tests: ordered and independent interpretation performed. Decision-making  details documented in ED Course.  Risk Prescription drug management. Drug therapy requiring intensive monitoring for toxicity. Decision regarding hospitalization.   Patient is a 61 year old male presenting today with symptoms concerning for high morbidity and mortality with LVO positive right-sided deficits, hemineglect and difficulty following commands.  Patient was a code stroke as his last seen normal was 9:30 PM last night and is within the 24-hour window with positive LVO.  He has a high NIH score.  Neurology was present when patient arrived.  Patient is noted to be hypertensive and tachycardic here but is not displaying any signs of seizure-like activity.  Airway appears to be intact at this time but patient is drooling.  Concern for ischemic versus hemorrhagic stroke versus mass.  I have independently visualized and interpreted pt's images today. Head CT today with significant intracranial hemorrhage in the intraparenchymal area.  Patient was given labetalol for tachycardia and hypertension.  He was started on a hypersaline infusion.  He will be admitted to the neuro ICU given his acute findings.  I independently  interpreted patient's labs and EKG today and EKG shows prolonged QT with evidence concerning for LVH but no old to compare.  CBC, CMP within normal limits, EtOH is less than 10 and coags are normal.  Patient has become a little more somnolent but still has a gag reflex and at this time does not appear to need intubation.  Patient admitted to the ICU.  CRITICAL CARE Performed by: Luisa Louk Total critical care time: 30 minutes Critical care time was exclusive of separately billable procedures and treating other patients. Critical care was necessary to treat or prevent imminent or life-threatening deterioration. Critical care was time spent personally by me on the following activities: development of treatment plan with patient and/or surrogate as well as nursing, discussions with consultants, evaluation of patient's response to treatment, examination of patient, obtaining history from patient or surrogate, ordering and performing treatments and interventions, ordering and review of laboratory studies, ordering and review of radiographic studies, pulse oximetry and re-evaluation of patient's condition.         Final Clinical Impression(s) / ED Diagnoses Final diagnoses:  Intracranial hemorrhage (HCC)  Hypertension, unspecified type    Rx / DC Orders ED Discharge Orders     None         Gwyneth Sprout, MD 2022-04-29 1156    Gwyneth Sprout, MD 04/29/22 1156

## 2022-04-18 NOTE — Progress Notes (Signed)
Peripherally Inserted Central Catheter Placement  The IV Nurse has discussed with the patient and/or persons authorized to consent for the patient, the purpose of this procedure and the potential benefits and risks involved with this procedure.  The benefits include less needle sticks, lab draws from the catheter, and the patient may be discharged home with the catheter. Risks include, but not limited to, infection, bleeding, blood clot (thrombus formation), and puncture of an artery; nerve damage and irregular heartbeat and possibility to perform a PICC exchange if needed/ordered by physician.  Alternatives to this procedure were also discussed.  Bard Power PICC patient education guide, fact sheet on infection prevention and patient information card has been provided to patient /or left at bedside.  Patient became agitated at beginning of procedure when lidocaine was placed requiring the bedside nurse to come in during the procedure and administer sedation.  PICC Placement Documentation  PICC Triple Lumen 05/02/2022 Left Brachial 45 cm 0 cm (Active)  Indication for Insertion or Continuance of Line Vasoactive infusions;Limited venous access - need for IV therapy >5 days (PICC only);Prolonged intravenous therapies 04/09/2022 1908  Exposed Catheter (cm) 0 cm 04/17/2022 1908  Site Assessment Clean;Dry;Intact 04/09/2022 1908  Lumen #1 Status Flushed;Saline locked;Blood return noted 04/11/2022 1908  Lumen #2 Status Flushed;Saline locked;Blood return noted 04/16/2022 1908  Lumen #3 Status Flushed;Saline locked;Blood return noted 05/01/2022 1908  Dressing Type Transparent;Securing device 04/15/2022 1908  Dressing Status Antimicrobial disc in place 04/17/2022 1908  Safety Lock Not Applicable 04/15/2022 1908  Line Care Connections checked and tightened 04/19/2022 1908  Dressing Intervention New dressing 04/10/2022 1908  Dressing Change Due 04/25/22 04/22/2022 1908       Niccolas Loeper, Lajean Manes 04/15/2022, 7:10 PM

## 2022-04-18 NOTE — Progress Notes (Signed)
EEG complete - results pending 

## 2022-04-18 NOTE — Progress Notes (Signed)
Jasper Riling will take pt belongings pants shoes socks shirt tie car keys and cell phone I will call security to lock patients wallet.

## 2022-04-18 NOTE — H&P (Signed)
Neurology H&P  Aaron Fitzgerald MR# [id number] 2022-04-22   CC: code stroke  History is obtained from: EMS and chart.  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  He was found by the complications morning in his car nonverbal and right-sided weakness.  LKW: 2100-2130 tNK given: No hemorrhagic stroke IR Thrombectomy No, not a candidate Modified Rankin Scale: 0-Completely asymptomatic and back to baseline post- stroke NIHSS: 20  ROS: Unable to assess due to patient being mute.  Past Medical History:  Diagnosis Date   Allergy      No family history on file.  Social History:  reports that he has never smoked. He does not have any smokeless tobacco history on file. He reports current alcohol use. He reports that he does not use drugs.   Prior to Admission medications   Medication Sig Start Date End Date Taking? Authorizing Provider  amLODipine (NORVASC) 10 MG tablet Take 1 tablet (10 mg total) by mouth daily. 03/25/13   Jonita Albee, MD    Exam: Current vital signs: There were no vitals taken for this visit.  Physical Exam  Constitutional: Appears well-developed and well-nourished.  Psych: Affect appropriate to situation Eyes: No scleral injection HENT: No OP obstruction. Head: Normocephalic.  Cardiovascular: Normal rate and regular rhythm.  Respiratory: Effort normal, symmetric excursions bilaterally, no audible wheezing. GI: Soft.  No distension. There is no tenderness.  Skin: WDI  Neuro: Mental Status: Awake and nonverbal Speech Global aphasia. rVisual neglect. Visual Fields right hemianopia. Pupils are equal, round, and reactive to light. EOMI without ptosis or diplopia.  Facial sensation is decreased on right. Right lower face weakness. Hearing is intact to voice. Uvula midline and palate elevates symmetrically. Tongue is midline without atrophy or  fasciculations.  Tone is normal. Bulk is normal. 2-3/5 strength in all four extremities. Sensation withdraws to pain in the arms and legs. Toes up on right.  FNF and HKS unable to perform Gait - Deferred  I have reviewed labs in epic and the pertinent results are: CBG 117  I have reviewed the images obtained: NCT head showed acute left frontal parenchymal hemorrhage involving basal ganglia and surrounding white matter with associated edema and mild mass effect. No definite intraventricular extension at this time.  CTA head and neck showed no large vessel occlusion.  Assessment: Aaron Fitzgerald is a 61 y.o. 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    Impression:  Hypertensive emergency Acute left basal ganglia intraparenchymal hemorrhage. Cerebral edema Global aphasia Bilateral upper and lower extremity weakness. NIHSS 22 ICH score 0   Plan: Elevate head of bed keep head midline.  Blood pressure: MAP >65 SBP <130-150: - Start clevidipine infusion may increase to maximum 53mcg/min as needed to maintain SBP<140. - Labetalol 20mg  every 10 minutes as needed if SBP>140.  3% Na 250cc bolus followed by 75cc/hr infusion - Goal serum Na 150-155. Serum Na every 6 hours.  X-ray chest. Admit to neuro ICU. Consult neurosurgery. Hold antiplatelets and anticoagulation for now. IV fluids gentle hydration. Repeat CT head in 6 hours (or sooner if clinical worsening). Echocardiogram. MRI brain without contrast when able. Keep platelets >100k, INR<1.4 Replete electrolytes as needed. Labs: Coags, CBC, CMP, Mg, Phos, fasting lipids, HbA1c, troponin, urinalysis. Maintain O2 sats > 94%. Normothermia - For temperature >  37.5C - acetaminophen 650mg  q4-6 hours PRN. Relative euglycemia (~ <180) and treat if hyperglycemia (>200 mg/dL)/hypoglycemia (< 60mg /dL). Avoid  dextrose containing fluids. Euvolemia - Strict I/Os. Precautions: Airway and herniation, seizure, aspiration. PPx: SCDs for now, Senna/docusate, PPI. Precautions: Aspiration/seizure/fall.    This patient is critically ill and at significant risk of neurological worsening, death and care requires constant monitoring of vital signs, hemodynamics,respiratory and cardiac monitoring, neurological assessment, discussion with family, other specialists and medical decision making of high complexity. I spent 75 minutes of neurocritical care time  in the care of  this patient. This was time spent independent of any time provided by nurse practitioner or PA.  Electronically signed by:  , MD Page: 04-28-22, 10:44 AM  If 7pm- 7am, please page neurology on call as listed in [facility].

## 2022-04-18 NOTE — Progress Notes (Signed)
Ventilator patient transported from 4N32 to CT and back without any issues. Bite block placed to keep patient from biting on ETT.

## 2022-04-18 NOTE — Progress Notes (Signed)
Initial Nutrition Assessment  DOCUMENTATION CODES:   Not applicable  INTERVENTION:   Initiate tube feeding via OG tube: Osmolite 1.5 at 25 ml/h and increase by 10 ml every 8 hours to goal rate of 55 ml/hr (1320 ml per day) Prosource TF 45 ml TID  Provides 2100 kcal, 115 gm protein, 1003 ml free water daily  Discussed plan with RN  NUTRITION DIAGNOSIS:   Inadequate oral intake related to inability to eat as evidenced by NPO status.  GOAL:   Patient will meet greater than or equal to 90% of their needs  MONITOR:   TF tolerance  REASON FOR ASSESSMENT:   Consult Enteral/tube feeding initiation and management  ASSESSMENT:   Pt is a Jehovah witness with PMH of HTN admitted with L basal ganglia IPH due to uncontrolled HTN.   Spoke with pt's RN at bedside while pt receiving ECHO, LTM being set up as well.  Pt intubated on cleviprex and propofol   Medications reviewed and include: colace, SSI, protoinx, miralax, senokot-s  Cleviprex @ 38 ml/hr provides: 1824 kcal  Propofol @ 5 ml/hr provides: 132 kcal  Hypertonic saline  Labs reviewed: K 3.4  CBG's: 138  NUTRITION - FOCUSED PHYSICAL EXAM:  Pt having LTM and ECHO during visit   Diet Order:   Diet Order             Diet NPO time specified  Diet effective now                   EDUCATION NEEDS:   Not appropriate for education at this time  Skin:  Skin Assessment: Reviewed RN Assessment  Last BM:  unknown, abd distended and firm per RN  Height:   Ht Readings from Last 1 Encounters:  04/12/2022 5\' 6"  (1.676 m)    Weight:   Wt Readings from Last 1 Encounters:  04/24/2022 85 kg    BMI:  Body mass index is 30.25 kg/m.  Estimated Nutritional Needs:   Kcal:  2000-2300  Protein:  100-115 grams  Fluid:  >2 L/day  04/20/22., RD, LDN, CNSC See [facility] for contact information

## 2022-04-18 NOTE — Progress Notes (Signed)
2 mg versed wasted in stericycle with Kathaleen Maser RN

## 2022-04-18 NOTE — Procedures (Signed)
Intubation Procedure Note  Honorio Devol  [id number]  1960/11/19  Date:May 06, 2022  Time:1:57 PM   Provider Performing:Jrue Yambao Judie Petit Janyth Contes    Procedure: Intubation (31500)  Indication(s) Respiratory Failure  Consent Risks of the procedure as well as the alternatives and risks of each were explained to the patient and/or caregiver.  Consent for the procedure was obtained and is signed in the bedside chart   Anesthesia Versed, Fentanyl, and Rocuronium   Time Out Verified patient identification, verified procedure, site/side was marked, verified correct patient position, special equipment/implants available, medications/allergies/relevant history reviewed, required imaging and test results available.   Sterile Technique Usual hand hygeine, masks, and gloves were used   Procedure Description Patient positioned in bed supine.  Sedation given as noted above.  Patient was intubated with endotracheal tube using Glidescope.  View was Grade 1 full glottis .  Number of attempts was 1.  Colorimetric CO2 detector was consistent with tracheal placement.   Complications/Tolerance None; patient tolerated the procedure well. Chest X-ray is ordered to verify placement.   EBL None.  Specimen(s) None

## 2022-04-18 NOTE — Consult Note (Addendum)
NAME:  Aaron Fitzgerald, MRN:  [id number], DOB:  1961-10-08, LOS: 0 ADMISSION DATE:  05/01/2022, CONSULTATION DATE:  04/28/2022 REFERRING MD:  Derrek Gu, CHIEF COMPLAINT:  encephalopathy   History of Present Illness:  Aaron Fitzgerald is a 61 y/o gentleman with a history of HTN treated with amlodipine who was found alert, non-verbal with right sided weakness in [place] in his [place]. Last known normal 2100 6/15. On arrival to ED patient is hypertensive with BP 214/144. On examination with global asphasia and right sided neglect and right side paralysis and noted facial drooling. NIHSS 22. Acute head with left frontal parenchymal hemorrhage involving basal ganglia with associated mild mass effect. ICH score 0. Neurology consulted. Started on Clevidipine for SBP 130-150, given 3% bolus and started on 75 cc/hr gtt with NA goal 150-155. Admitted to Neuro ICU. During stay with decreased mentation and inability to protect airway. Critical Care consulted.   Pertinent  Medical History  HTN Jehova's witness  Significant Hospital Events: Including procedures, antibiotic start and stop dates in addition to other pertinent events   6/16 admitted, intubated  Interim History / Subjective:  As above.   Objective   Blood pressure (!) 151/103, pulse 85, temperature 98.7 F (37.1 C), temperature source Oral, resp. rate (!) 23, height 5\' 6"  (1.676 m), weight 85 kg, SpO2 95 %.    Vent Mode: PRVC FiO2 (%):  [100 %] 100 % Set Rate:  [18 bmp] 18 bmp Vt Set:  [510 mL] 510 mL PEEP:  [5 cmH20] 5 cmH20 Plateau Pressure:  [15 cmH20] 15 cmH20   Intake/Output Summary (Last 24 hours) at 04/23/2022 1345 Last data filed at 05/02/2022 1200 Gross per 24 hour  Intake 274.05 ml  Output --  Net 274.05 ml   Filed Weights   04/09/2022 1000  Weight: 85 kg    Examination: General: older adult male, lying in bed, mild respiratory distress  HENT: dry MM  Lungs: rhonchi, mild use of accessory muscles   Cardiovascular: RRR, HR 82, no mRG Abdomen: soft, non-distended, active bowel sounds  Extremities: +1 BLE edema  Neuro: arouses with physical stimulation, does not follow commands, global aphasia, spontaneous on left hemibody, withdrawals left lower, flicker to left upper, pupils 2 mm bilaterally and reactive  GU: external foley in place   Resolved Hospital Problem list     Assessment & Plan:   Left basal ganglia IPH with mass effect and midline shift suspect in setting of uncontrolled HTN -SBP  214/144 on arrival  -ICH 0 -NIHSS 22  P -Neurology and Neurosurgery following  -Plan for 6 hour CT scan > if worsening NSY considering surgical decompression -Plan for EEG (during examination with facial twitching concerning for seizures)  -Frequent Neuro Checks  -Continue 3% for NA goal 150-155. Trend Sodium q4h.  -Lipid Profile and Hemoglobin AIC pending   -PT/OT/ST when medically appropriate  -Sedation needs: Start Propofol for RASS goal 0/-1 (follow triglyceride level), PRN fentanyl   HTN Emergency  H/O HTN on Norvasc 10 mg daily  P -Cardiac Monitoring -Titrate Cleveprex for SBP goal 130-150   Respiratory Insufficieny in setting of IPH  P -Intubate now concern for aspiration and decreased mentation  -Vent Support -VAP prevention bundle     Best Practice (right click and "Reselect all SmartList Selections" daily)   Diet/type: NPO DVT prophylaxis: SCD GI prophylaxis: PPI Lines: N/A Foley:  N/A Code Status:  full code Last date of multidisciplinary goals of care discussion [church family at bedside,  attempting to get in contact with family who are not local]  Labs   CBC: Recent Labs  Lab 05-May-2022 1040 2022/05/05 1047  WBC 10.5  --   NEUTROABS 8.8*  --   HGB 16.5 18.7*  HCT 53.2* 55.0*  MCV 73.7*  --   PLT 147*  --     Basic Metabolic Panel: Recent Labs  Lab 05-May-2022 1040 2022/05/05 1047  NA 138 138  K 3.5 3.4*  CL 103 103  CO2 19*  --   GLUCOSE 130* 128*   BUN 12 14  CREATININE 1.20 1.00  CALCIUM 9.5  --    GFR: Estimated Creatinine Clearance: 79.3 mL/min (by C-G formula based on SCr of 1 mg/dL). Recent Labs  Lab 2022-05-05 1040  WBC 10.5    Liver Function Tests: Recent Labs  Lab 05-May-2022 1040  AST 24  ALT 23  ALKPHOS 86  BILITOT 0.9  PROT 7.9  ALBUMIN 4.1   No results for input(s): "LIPASE", "AMYLASE" in the last 168 hours. No results for input(s): "AMMONIA" in the last 168 hours.  ABG    Component Value Date/Time   TCO2 21 (L) 2022-05-05 1047     Coagulation Profile: Recent Labs  Lab 05/05/22 1040  INR 1.0    Cardiac Enzymes: No results for input(s): "CKTOTAL", "CKMB", "CKMBINDEX", "TROPONINI" in the last 168 hours.  HbA1C: No results found for: "HGBA1C"  CBG: Recent Labs  Lab 05-May-2022 1042  GLUCAP 138*    Review of Systems:   Unable to review given altered mentation   Past Medical History:  He,  has a past medical history of Allergy, HTN (hypertension), and Refusal of blood transfusions as patient is Jehovah's Witness.   Surgical History:   Past Surgical History:  Procedure Laterality Date   HERNIA REPAIR       Social History:   reports that he has never smoked. He does not have any smokeless tobacco history on file. He reports current alcohol use. He reports that he does not use drugs.   Family History:  His family history is not on file.   Allergies Allergies  Allergen Reactions   Plasma, Human     Jehova's witness   Red Blood Cells     Jehova's witness   Whole Blood     Jehova's witness     Home Medications  Prior to Admission medications   Medication Sig Start Date End Date Taking? Authorizing Provider  amLODipine (NORVASC) 10 MG tablet Take 1 tablet (10 mg total) by mouth daily. 03/25/13   Jonita Albee, MD     Critical care time: 48 minutes     Jovita Kussmaul, AGACNP-BC [facility]  PCCM Pgr: [phone]

## 2022-04-18 NOTE — Progress Notes (Signed)
Updated 2 close friends/ HPOAs. They confirmed Jehovah's witness and refusal of blood products but are unsure about albumin.  Steffanie Dunn, DO May 06, 2022 4:15 PM [facility]

## 2022-04-18 NOTE — Plan of Care (Signed)
Reviewed care plan with patients HCPOA

## 2022-04-19 ENCOUNTER — Inpatient Hospital Stay (HOSPITAL_COMMUNITY): Payer: Self-pay

## 2022-04-19 DIAGNOSIS — I619 Nontraumatic intracerebral hemorrhage, unspecified: Secondary | ICD-10-CM

## 2022-04-19 LAB — SODIUM
Sodium: 144 mmol/L (ref 135–145)
Sodium: 152 mmol/L — ABNORMAL HIGH (ref 135–145)
Sodium: 152 mmol/L — ABNORMAL HIGH (ref 135–145)
Sodium: 152 mmol/L — ABNORMAL HIGH (ref 135–145)
Sodium: 154 mmol/L — ABNORMAL HIGH (ref 135–145)

## 2022-04-19 LAB — GLUCOSE, CAPILLARY
Glucose-Capillary: 131 mg/dL — ABNORMAL HIGH (ref 70–99)
Glucose-Capillary: 152 mg/dL — ABNORMAL HIGH (ref 70–99)
Glucose-Capillary: 158 mg/dL — ABNORMAL HIGH (ref 70–99)
Glucose-Capillary: 160 mg/dL — ABNORMAL HIGH (ref 70–99)
Glucose-Capillary: 161 mg/dL — ABNORMAL HIGH (ref 70–99)
Glucose-Capillary: 162 mg/dL — ABNORMAL HIGH (ref 70–99)

## 2022-04-19 LAB — URINALYSIS, ROUTINE W REFLEX MICROSCOPIC
Bacteria, UA: NONE SEEN
Bilirubin Urine: NEGATIVE
Glucose, UA: NEGATIVE mg/dL
Ketones, ur: NEGATIVE mg/dL
Leukocytes,Ua: NEGATIVE
Nitrite: NEGATIVE
Protein, ur: 100 mg/dL — AB
Specific Gravity, Urine: 1.043 — ABNORMAL HIGH (ref 1.005–1.030)
pH: 6 (ref 5.0–8.0)

## 2022-04-19 LAB — CBC
HCT: 42.6 % (ref 39.0–52.0)
Hemoglobin: 13.3 g/dL (ref 13.0–17.0)
MCH: 23 pg — ABNORMAL LOW (ref 26.0–34.0)
MCHC: 31.2 g/dL (ref 30.0–36.0)
MCV: 73.6 fL — ABNORMAL LOW (ref 80.0–100.0)
Platelets: 122 10*3/uL — ABNORMAL LOW (ref 150–400)
RBC: 5.79 MIL/uL (ref 4.22–5.81)
RDW: 15.4 % (ref 11.5–15.5)
WBC: 9.8 10*3/uL (ref 4.0–10.5)
nRBC: 0 % (ref 0.0–0.2)

## 2022-04-19 LAB — BASIC METABOLIC PANEL
Anion gap: 8 (ref 5–15)
BUN: 15 mg/dL (ref 8–23)
CO2: 20 mmol/L — ABNORMAL LOW (ref 22–32)
Calcium: 8.2 mg/dL — ABNORMAL LOW (ref 8.9–10.3)
Chloride: 118 mmol/L — ABNORMAL HIGH (ref 98–111)
Creatinine, Ser: 1.24 mg/dL (ref 0.61–1.24)
GFR, Estimated: >60 mL/min (ref >60)
Glucose, Bld: 140 mg/dL — ABNORMAL HIGH (ref 70–99)
Potassium: 3.9 mmol/L (ref 3.5–5.1)
Sodium: 146 mmol/L — ABNORMAL HIGH (ref 135–145)

## 2022-04-19 LAB — RAPID URINE DRUG SCREEN, HOSP PERFORMED
Amphetamines: NOT DETECTED
Barbiturates: NOT DETECTED
Benzodiazepines: POSITIVE — AB
Cocaine: NOT DETECTED
Opiates: NOT DETECTED
Tetrahydrocannabinol: NOT DETECTED

## 2022-04-19 LAB — MAGNESIUM: Magnesium: 2.3 mg/dL (ref 1.7–2.4)

## 2022-04-19 LAB — TRIGLYCERIDES: Triglycerides: 73 mg/dL (ref <150)

## 2022-04-19 LAB — PHOSPHORUS: Phosphorus: 2.2 mg/dL — ABNORMAL LOW (ref 2.5–4.6)

## 2022-04-19 MED ORDER — HYDROCHLOROTHIAZIDE 25 MG PO TABS
25.0000 mg | ORAL_TABLET | Freq: Every day | ORAL | Status: DC
  Administered 2022-04-19 – 2022-04-21 (×3): 25 mg
  Filled 2022-04-19 (×3): qty 1

## 2022-04-19 MED ORDER — LISINOPRIL 20 MG PO TABS
40.0000 mg | ORAL_TABLET | Freq: Every day | ORAL | Status: DC
  Administered 2022-04-20 – 2022-04-21 (×2): 40 mg
  Filled 2022-04-19 (×2): qty 2

## 2022-04-19 MED ORDER — SODIUM CHLORIDE 3 % IV SOLN
INTRAVENOUS | Status: DC
  Administered 2022-04-20: 50 mL/h via INTRAVENOUS
  Filled 2022-04-19 (×2): qty 500

## 2022-04-19 MED ORDER — LABETALOL HCL 5 MG/ML IV SOLN
20.0000 mg | INTRAVENOUS | Status: DC | PRN
  Administered 2022-04-19 – 2022-04-20 (×4): 20 mg via INTRAVENOUS
  Filled 2022-04-19 (×5): qty 4

## 2022-04-19 MED ORDER — SENNOSIDES-DOCUSATE SODIUM 8.6-50 MG PO TABS
1.0000 | ORAL_TABLET | Freq: Two times a day (BID) | ORAL | Status: DC
  Administered 2022-04-19 – 2022-04-21 (×5): 1
  Filled 2022-04-19 (×4): qty 1

## 2022-04-19 MED ORDER — HYDRALAZINE HCL 50 MG PO TABS
100.0000 mg | ORAL_TABLET | Freq: Three times a day (TID) | ORAL | Status: DC
Start: 2022-04-20 — End: 2022-04-21
  Administered 2022-04-20 – 2022-04-21 (×5): 100 mg
  Filled 2022-04-19 (×5): qty 2

## 2022-04-19 MED ORDER — HYDRALAZINE HCL 20 MG/ML IJ SOLN
20.0000 mg | Freq: Four times a day (QID) | INTRAMUSCULAR | Status: DC | PRN
  Administered 2022-04-20: 20 mg via INTRAVENOUS
  Filled 2022-04-19: qty 1

## 2022-04-19 MED ORDER — SODIUM CHLORIDE 3 % IV SOLN
250.0000 mL | Freq: Once | INTRAVENOUS | Status: AC
  Administered 2022-04-19: 250 mL via INTRAVENOUS

## 2022-04-19 NOTE — Procedures (Addendum)
Patient Name: Aaron Fitzgerald  MRN: [id number]  Epilepsy Attending: Charlsie Quest  Referring Physician/Provider: Marvel Plan, MD  Duration: 05-09-22 1556 to 04/19/2022 1556   Patient history:  61 y.o. 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 EEG to evaluate for seizure   Level of alertness: lethargic    AEDs during EEG study: LEV, propofol   Technical aspects: This EEG study was done with scalp electrodes positioned according to the 10-20 International system of electrode placement. Electrical activity was acquired at a sampling rate of 500Hz  and reviewed with a high frequency filter of 70Hz  and a low frequency filter of 1Hz . EEG data were recorded continuously and digitally stored.    Description: EEG showed continuous generalized and lateralized left hemisphere 3 to 6 Hz theta-delta slowing admixed with an excessive amount of 15 to 18 Hz beta activity distributed symmetrically and diffusely. Hyperventilation and photic stimulation were not performed.      ABNORMALITY - Continuous slow, generalized and lateralized left hemisphere   IMPRESSION: This study is suggestive of cortical dysfunction in left hemisphere likely due to underlying hemorrhage. There is also moderate to severe diffuse encephalopathy, nonspecific etiology but likely related to sedation. No seizures or epileptiform discharges were seen throughout the recording.   Linh Hedberg 

## 2022-04-19 NOTE — Progress Notes (Addendum)
LTM maint complete - no skin breakdown Serviced several leads. [facility] monitored, Event button test confirmed by [facility].  

## 2022-04-19 NOTE — Progress Notes (Addendum)
STROKE TEAM PROGRESS NOTE   INTERVAL HISTORY His church family is at the bedside, no immediate family present, family lives in [place]. Has sister in [place] who is in the hospital, no children or spouse.  RN at bedside. He is intubated in NAD. He is on propofol  mcg, cleviprex @  and 3% saline @ 75cc/hr. Last sodium was 146. Pupils pinpoint with minimal reactive. Corneals absent bilateral, cough/gag present. Withdraws on left, Right side flaccid on LTM. LTM with generalized slowing on left, no seizures identified. Will give 250cc bolus of 3% and increase drip to 85cc/hr. Recheck of sodium is 152. Have added HCTZ  daily PO to assist with weaning down cleviprex drip. SBP goal < 160  Vitals:   04/19/22 0617 04/19/22 0630 04/19/22 0645 04/19/22 0700  BP: (!) 199/97 (!) 146/79 138/78 131/76  Pulse:  90 86 83  Resp:  19 (!) 33 (!) 30  Temp:      TempSrc:      SpO2:  99% 98% 99%  Weight:      Height:       CBC:  Recent Labs  Lab 04/04/2022 1040 04/11/2022 1047 04/28/2022 1542 04/19/22 0512  WBC 10.5  --   --  9.8  NEUTROABS 8.8*  --   --   --   HGB 16.5   < > 17.7* 13.3  HCT 53.2*   < > 52.0 42.6  MCV 73.7*  --   --  73.6*  PLT 147*  --   --  122*   < > = values in this interval not displayed.   Basic Metabolic Panel:  Recent Labs  Lab 04/26/2022 1040 04/27/2022 1047 04/06/2022 1238 04/17/2022 1542 04/19/2022 1811 04/10/2022 2338 04/19/22 0512  NA 138 138   < > 142   < > 144 146*  K 3.5 3.4*  --  3.6  --   --  3.9  CL 103 103  --   --   --   --  118*  CO2 19*  --   --   --   --   --  20*  GLUCOSE 130* 128*  --   --   --   --  140*  BUN 12 14  --   --   --   --  15  CREATININE 1.20 1.00  --   --   --   --  1.24  CALCIUM 9.5  --   --   --   --   --  8.2*  MG  --   --   --   --   --   --  2.3  PHOS  --   --   --   --   --   --  2.2*   < > = values in this interval not displayed.   Lipid Panel:  Recent Labs  Lab 04/27/2022 1238 04/19/22 0512  CHOL 201*  --   TRIG 130 73  HDL 63   --   CHOLHDL 3.2  --   VLDL 26  --   LDLCALC 112*  --    HgbA1c:  Recent Labs  Lab 04/08/2022 1225  HGBA1C 5.8*   Urine Drug Screen: No results for input(s): "LABOPIA", "COCAINSCRNUR", "LABBENZ", "AMPHETMU", "THCU", "LABBARB" in the last 168 hours.  Alcohol Level  Recent Labs  Lab 04/10/2022 1040  ETH <10    IMAGING past 24 hours CT HEAD WO CONTRAST ( )  Result Date: 04/05/2022 CLINICAL DATA:  Hemorrhagic stroke EXAM: CT HEAD WITHOUT CONTRAST TECHNIQUE: Contiguous axial images were obtained from the base of the skull through the vertex without intravenous contrast. RADIATION DOSE REDUCTION: This exam was performed according to the departmental dose-optimization program which includes automated exposure control, adjustment of the mA and/or kV according to patient size and/or use of iterative reconstruction technique. COMPARISON:  04/11/2022 FINDINGS: Brain: Unchanged size of intraparenchymal hematoma centered in the left basal ganglia with mild surrounding edema. There is periventricular hypoattenuation compatible with chronic microvascular disease. There is unchanged mass effect on the left lateral ventricle with minimal rightward midline shift. No new site of hemorrhage. No hydrocephalus or extra-axial collection. Vascular: No hyperdense vessel or unexpected calcification. Skull: Normal. Negative for fracture or focal lesion. Sinuses/Orbits: No acute finding. Other: None. IMPRESSION: Unchanged size of intraparenchymal hematoma centered in the left basal ganglia with mild surrounding edema and unchanged mass effect on the left lateral ventricle and minimal rightward midline shift. Electronically Signed   By: Deatra Robinson M.D.   On: 04/15/2022 17:17   [place] EKG SITE RITE  Result Date: 04/17/2022 If Site Rite image not attached, placement could not be confirmed due to current cardiac rhythm.  ECHOCARDIOGRAM COMPLETE  Result Date: 04/08/2022    ECHOCARDIOGRAM REPORT   Patient Name:   Aaron Fitzgerald Date of Exam: 04/24/2022 Medical Rec #:  [id number]        Height:       66.0 in Accession #:    [id number]       Weight:       187.4 lb Date of Birth:  Jun 03, 1961       BSA:          1.945 m Patient Age:    61 years         BP:           161/106 mmHg Patient Gender: M                HR:           75 bpm. Exam Location:  Inpatient Procedure: 2D Echo, 3D Echo, Cardiac Doppler, Color Doppler and Strain Analysis Indications:    Stroke I63.9  History:        Patient has no prior history of Echocardiogram examinations.                 Stroke; Risk Factors:Hypertension. ICH.  Sonographer:    Sheralyn Boatman RDCS Referring Phys: [id number] [facility]  Sonographer Comments: Suboptimal parasternal window and echo performed with patient supine and on artificial respirator. Global longitudinal strain was attempted. IMPRESSIONS  1. Left ventricular ejection fraction, by estimation, is 55 to 60%. The left ventricle has normal function. The left ventricle has no regional wall motion abnormalities. There is severe left ventricular hypertrophy. Left ventricular diastolic parameters  are consistent with Grade I diastolic dysfunction (impaired relaxation). Elevated left ventricular end-diastolic pressure. The average left ventricular global longitudinal strain is -18.5 %. The global longitudinal strain is normal.  2. Right ventricular systolic function is normal. The right ventricular size is normal. There is normal pulmonary artery systolic pressure.  3. The pericardial effusion is posterior to the left ventricle.  4. The mitral valve is abnormal. No evidence of mitral valve regurgitation. No evidence of mitral stenosis.  5. The aortic valve is tricuspid. There is mild calcification of the aortic valve. Aortic valve regurgitation is mild. Aortic valve sclerosis is present, with no evidence of aortic valve stenosis.  6. The  inferior vena cava is normal in size with greater than 50% respiratory variability, suggesting right atrial  pressure of 3 mmHg. FINDINGS  Left Ventricle: Left ventricular ejection fraction, by estimation, is 55 to 60%. The left ventricle has normal function. The left ventricle has no regional wall motion abnormalities. The average left ventricular global longitudinal strain is -18.5 %. The global longitudinal strain is normal. The left ventricular internal cavity size was normal in size. There is severe left ventricular hypertrophy. Left ventricular diastolic parameters are consistent with Grade I diastolic dysfunction (impaired relaxation). Elevated left ventricular end-diastolic pressure. Right Ventricle: The right ventricular size is normal. No increase in right ventricular wall thickness. Right ventricular systolic function is normal. There is normal pulmonary artery systolic pressure. The tricuspid regurgitant velocity is 1.91 m/s, and  with an assumed right atrial pressure of 3 mmHg, the estimated right ventricular systolic pressure is 17.6 mmHg. Left Atrium: Left atrial size was normal in size. Right Atrium: Right atrial size was normal in size. Pericardium: Trivial pericardial effusion is present. The pericardial effusion is posterior to the left ventricle. Mitral Valve: The mitral valve is abnormal. There is mild thickening of the mitral valve leaflet(s). There is mild calcification of the mitral valve leaflet(s). No evidence of mitral valve regurgitation. No evidence of mitral valve stenosis. MV peak gradient, 8.1 mmHg. The mean mitral valve gradient is 3.0 mmHg. Tricuspid Valve: The tricuspid valve is normal in structure. Tricuspid valve regurgitation is not demonstrated. No evidence of tricuspid stenosis. Aortic Valve: The aortic valve is tricuspid. There is mild calcification of the aortic valve. Aortic valve regurgitation is mild. Aortic regurgitation PHT measures 576 msec. Aortic valve sclerosis is present, with no evidence of aortic valve stenosis. Pulmonic Valve: The pulmonic valve was normal in  structure. Pulmonic valve regurgitation is not visualized. No evidence of pulmonic stenosis. Aorta: The aortic root is normal in size and structure. Venous: The inferior vena cava is normal in size with greater than 50% respiratory variability, suggesting right atrial pressure of 3 mmHg. IAS/Shunts: No atrial level shunt detected by color flow Doppler.  LEFT VENTRICLE PLAX 2D LVIDd:         4.10 cm     Diastology LVIDs:         2.50 cm     LV e' medial:    3.60 cm/s LV PW:         1.90 cm     LV E/e' medial:  19.8 LV IVS:        2.00 cm     LV e' lateral:   4.35 cm/s LVOT diam:     2.40 cm     LV E/e' lateral: 16.4 LV SV:         106 LV SV Index:   55          2D Longitudinal Strain LVOT Area:     4.52 cm    2D Strain GLS Avg:     -18.5 %  LV Volumes (MOD) LV vol d, MOD A4C: 81.4 ml 3D Volume EF: LV vol s, MOD A4C: 31.4 ml 3D EF:        59 % LV SV MOD A4C:     81.4 ml LV EDV:       123 ml                            LV ESV:       50  ml                            LV SV:        73 ml RIGHT VENTRICLE             IVC RV S prime:     12.80 cm/s  IVC diam: 1.80 cm TAPSE (M-mode): 2.0 cm LEFT ATRIUM             Index       RIGHT ATRIUM           Index LA diam:        2.20 cm 1.13 cm/m  RA Area:     13.80 cm LA Vol (A2C):   15.7 ml 8.07 ml/m  RA Volume:   27.00 ml  13.88 ml/m LA Vol (A4C):   15.2 ml 7.81 ml/m LA Biplane Vol: 15.5 ml 7.97 ml/m  AORTIC VALVE LVOT Vmax:   133.00 cm/s LVOT Vmean:  79.100 cm/s LVOT VTI:    0.235 m AI PHT:      576 msec  AORTA Ao Root diam: 4.20 cm MITRAL VALVE                TRICUSPID VALVE MV Area (PHT): 3.27 cm     TR Peak grad:   14.6 mmHg MV Area VTI:   3.18 cm     TR Vmax:        191.00 cm/s MV Peak grad:  8.1 mmHg MV Mean grad:  3.0 mmHg     SHUNTS MV Vmax:       1.42 m/s     Systemic VTI:  0.24 m MV Vmean:      81.3 cm/s    Systemic Diam: 2.40 cm MV Decel Time: 232 msec MV E velocity: 71.30 cm/s MV A velocity: 117.00 cm/s MV E/A ratio:  0.61 Charlton Haws MD Electronically signed  by Charlton Haws MD Signature Date/Time: 04/16/2022/3:15:51 PM    Final    DG CHEST PORT 1 VIEW  Result Date: 04/17/2022 CLINICAL DATA:  Endotracheal and orogastric tube placement. EXAM: PORTABLE CHEST 1 VIEW COMPARISON:  None Available. FINDINGS: 1341 hours. Tip of the endotracheal tube overlies the mid trachea. Enteric tube projects below the diaphragm with the side hole near the gastroesophageal junction. The heart is mildly enlarged. There is patchy left lower lobe opacity, likely atelectasis. No edema, confluent airspace opacity, pneumothorax or significant pleural effusion. The bones appear unremarkable. IMPRESSION: 1. Satisfactory position of the endotracheal and enteric tubes. 2. Patchy left lower lobe atelectasis or early infiltrate. Electronically Signed   By: Carey Bullocks M.D.   On: 05/02/2022 13:59   EEG adult  Result Date: 04/08/2022 Charlsie Quest, MD     04/04/2022  5:55 PM Patient Name: Aaron Fitzgerald MRN: [id number] Epilepsy Attending: Charlsie Quest Referring Physician/Provider: Elmer Picker, NP Date: 04/30/2022 Duration: 24.54 mins Patient history:  61 y.o. 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 EEG to evaluate for seizure Level of alertness: lethargic AEDs during EEG study: LEV, propofol Technical aspects: This EEG study was done with scalp electrodes positioned according to the 10-20 International system of electrode placement. Electrical activity was acquired at a sampling rate of  and reviewed with a high frequency filter of  and a low frequency filter of . EEG data were recorded continuously and digitally  stored. Description: EEG showed continuous generalized 3 to 6 Hz theta-delta slowing admixed with an excessive amount of 15 to 18 Hz beta activity distributed symmetrically and diffusely. Hyperventilation and photic stimulation were not  performed.   ABNORMALITY - Continuous slow, generalized IMPRESSION: This study is suggestive of moderate to severe diffuse encephalopathy, nonspecific etiology but likely related to sedation. No seizures or epileptiform discharges were seen throughout the recording. Priyanka Annabelle Harman   CT ANGIO HEAD NECK W WO CM W PERF (CODE STROKE)  Result Date: April 21, 2022 CLINICAL DATA:  Neuro deficit, acute, stroke suspected right face and side weakness and mute EXAM: CT ANGIOGRAPHY HEAD AND NECK TECHNIQUE: Multidetector CT imaging of the head and neck was performed using the standard protocol during bolus administration of intravenous contrast. Multiplanar CT image reconstructions and MIPs were obtained to evaluate the vascular anatomy. Carotid stenosis measurements (when applicable) are obtained utilizing NASCET criteria, using the distal internal carotid diameter as the denominator. RADIATION DOSE REDUCTION: This exam was performed according to the departmental dose-optimization program which includes automated exposure control, adjustment of the mA and/or kV according to patient size and/or use of iterative reconstruction technique. CONTRAST:  48mL OMNIPAQUE IOHEXOL 350 MG/ML SOLN COMPARISON:  None Available. FINDINGS: CTA NECK Aortic arch: Great vessel origins are patent. Right carotid system: Patent. Minimal calcified plaque at the ICA origin. No stenosis. Left carotid system: Patent.  No stenosis. Vertebral arteries: Patent. The left V1 vertebral artery is partially obscured by artifact. Right vertebral is dominant. Skeleton: No acute osseous abnormality. Other neck: Unremarkable. Upper chest: No apical lung mass. Review of the MIP images confirms the above findings CTA HEAD Anterior circulation: Intracranial internal carotid arteries are patent. Anterior cerebral arteries are patent. 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 Middle cerebral arteries are patent. Irregularity of distal ACA and MCA  branches with areas of up to moderate stenosis. Posterior circulation: Intracranial vertebral arteries are patent. The left vertebral is diminutive after PICA origin. Basilar artery is patent. Major cerebellar artery origins are patent. Bilateral posterior communicating arteries are present. Posterior cerebral arteries are patent. Irregularity more distal PCA branches. Venous sinuses: Patent as allowed by contrast bolus timing. Review of the MIP images confirms the above findings IMPRESSION: No large vessel occlusion. Medium and small vessel irregularity involving both anterior and posterior circulations. Considerations include atherosclerosis or other vasculopathy/vasculitis. There is no aneurysm or AVM. Electronically Signed   By: Guadlupe Spanish M.D.   On: 04/10/2022 11:15   CT HEAD CODE STROKE WO CONTRAST  Result Date: 05/01/2022 CLINICAL DATA:  Code stroke. Neuro deficit, acute, stroke suspected right face and side weakness and mute EXAM: CT HEAD WITHOUT CONTRAST TECHNIQUE: Contiguous axial images were obtained from the base of the skull through the vertex without intravenous contrast. RADIATION DOSE REDUCTION: This exam was performed according to the departmental dose-optimization program which includes automated exposure control, adjustment of the mA and/or kV according to patient size and/or use of iterative reconstruction technique. COMPARISON:  None Available. FINDINGS: Brain: There is acute parenchymal hemorrhage involving the left frontal lobe subcortical and deep white matter above the level of the basal ganglia. There is also involvement of the left lentiform nucleus and subinsular white matter. Hemorrhage extends toward but does not definitely involve the left lateral ventricle. Surrounding edema is present with regional mass effect including partial effacement of the left lateral ventricle. Confluent areas of low-density in the supratentorial white matter nonspecific but may reflect advanced  chronic microvascular  ischemic changes. There is no hydrocephalus or extra-axial collection. Vascular: No hyperdense vessel. Skull: Unremarkable. Sinuses/Orbits: No acute abnormality. Other: Mastoid air cells are clear. IMPRESSION: Acute left frontal parenchymal hemorrhage involving basal ganglia and surrounding white matter. Associated edema with mild mass effect. No definite intraventricular extension at this time. Advanced chronic microvascular ischemic changes. Preliminary results were communicated to Dr. Common at 10:52 am on May 01, 2022 by text page via the [facility] messaging system. Electronically Signed   By: Guadlupe Spanish M.D.   On: 05/01/2022 10:57    PHYSICAL EXAM General: intubated and sedated male in NAD. Marland Kitchen Afebrile. Head is nontraumatic. Neck is supple without bruit.    Cardiac exam no murmur or gallop. Lungs are clear to auscultation. Distal pulses are well felt.  Neurological Exam :  sedated and intubated.  Eyes are closed.  Does not follow commands, does not respond to noxious stimuli. Pupils pinpoint with minimal reactive. Corneals absent bilateral, cough/gag present. Withdraws on left, Right side flaccid  ASSESSMENT/PLAN [name] is a 61 y.o. male with history of HTN treated with amlodipine who was found alert, non-verbal with right sided weakness in a church parking lot in his car. Last known normal 2100 6/15. He was hypertensive in the ED BP 214/144. NIHSS 22. 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 Associated edema with mild mass effect. No definite intraventricular extension at this time. Advanced chronic microvascular ischemic changes.  CTA head & neck  No large vessel occlusion. Medium and small  vessel irregularity involving both anterior and posterior circulations. Considerations include atherosclerosis or other vasculopathy/vasculitis. There is no aneurysm or AVM.  Repeat Stability Ct head -Unchanged size of intraparenchymal hematoma centered in the left basal ganglia with mild surrounding edema and unchanged mass effect on the left lateral ventricle and minimal rightward midline shift  MRI  ordered LTM 6/17-This study is suggestive of cortical dysfunction in left hemisphere likely due to underlying hemorrhage. There is also moderate to severe diffuse encephalopathy, nonspecific etiology but likely related to sedation. No seizures or epileptiform discharges were seen throughout the recording.  2D Echo EF 55-60%.  No shunt EKG SR UDS + benzos. UA negative On keppra 500mg  BID for seizure ppx LDL 112 HgbA1c 5.8 VTE prophylaxis - SCD's    Diet   Diet NPO time specified   No antithrombotic prior to admission, now on No antithrombotic.  Therapy recommendations:  pending  Disposition:  pending  Cerebral Edema  Iatrogenic Hypernatremia  On HTS @ 85cc/hr  Neurosuregery on board Last NA 152 Received 250cc bolus of 3% today  Serial NA checks  Hypertension Hypertensive Emergency  Home meds:  amlodipine 10mg  bid -restarted  UnStable On clevipres gtt @ 8mg . Titrate as able  Continue hydralazine 25mg  Q6hr Added HCTZ 25mg  daily  SBP goal < 160 PRN labetalol and hydralazine to maintain BP goal  Long-term BP goal normotensive  Hyperlipidemia Home meds:  none,  LDL 112, goal < 70 Consider adding statin at discharge  Continue statin at discharge  Diabetes type II Controlled Home meds: none  HgbA1c 5.8, goal < 7.0 CBGs Recent Labs    05/01/2022 2350 04/19/22 0341 04/19/22 0735  GLUCAP 182* 162* 160*   SSI  Other Stroke Risk Factors Obesity,  Body mass index is 30.25 kg/m., BMI >/= 30 associated with increased stroke risk, recommend weight loss, diet and exercise as  appropriate    Other Active Problems Acute Respiratory failure due to inability to protect airway  - VAP prevention protocol   - propofol for sedation  - SBT daily   - CXR/ ABG as needs   Dysphagia   - NG tube  - TF advancing to goal    Hospital day # 1  Gevena Martenise Wolfe DNP, ACNPC-AG    STROKE MD NOTE : I have personally obtained history,examined this patient, reviewed notes, independently viewed imaging studies, participated in medical decision making and plan of care.ROS completed by me personally and pertinent positives fully documented  I have made any additions or clarifications directly to the above note. Agree with note above.  Patient left evening charge service and started in his car and was unfortunately found next morning in the same position unresponsive with CT scan showing large left parenchymal brain hemorrhage with cytotoxic edema and mild left-to-right brain herniation.  Remains intubated and sedated on hypertonic saline neurological exam looks quite poor.  Patient has no family in the [place] and is church friends are trying to contact his family.  His prognosis is very guarded and he will likely not survive without major disability and requiring 24-hour care.  Continue strict blood pressure control with systolic goal 130-150 for the first 24 hours and then below 160.  Continue hypertonic saline with serum sodium goal 1 50-1 55.  Give to 50 cc 3% saline bolus today and increase maintenance rate to 85 cc an hour.  Start Norvasc and hydrochlorothiazide through NG tube and use as needed IV labetalol and hydralazine and wean off Cleviprex drip as tolerated.  Discussed with his church friends at the bedside and Dr. Wynona Neatlalere critical care medicine  This patient is critically ill and at significant risk of neurological worsening, death and care requires constant monitoring of vital signs, hemodynamics,respiratory and cardiac monitoring, extensive review of multiple databases, frequent  neurological assessment, discussion with family, other specialists and medical decision making of high complexity.I have made any additions or clarifications directly to the above note.This critical care time does not reflect procedure time, or teaching time or supervisory time of PA/NP/Med Resident etc but could involve care discussion time.  I spent 50 minutes of neurocritical care time  in the care of  this patient.      Delia HeadyPramod Xaiver Roskelley, MD Medical Director [facility] Pager: [phone] 04/19/2022 2:14 PM  To contact Stroke Continuity provider, please refer to [email or web address]. After hours, contact General Neurology

## 2022-04-19 NOTE — Progress Notes (Signed)
EEG maint complete.  ?

## 2022-04-19 NOTE — Progress Notes (Signed)
OT Cancellation Note  Patient Details Name: Aaron Fitzgerald MRN: [id number] DOB: 08-Dec-1960   Cancelled Treatment:    Reason Eval/Treat Not Completed: Patient not medically ready (Pt is intubated and sedated. Per discussion with RN, therapies to hold evaluations for today and re-attempt when appropriate.)  [name] A [name] 04/19/2022, 10:13 AM

## 2022-04-19 NOTE — Progress Notes (Signed)
PT Cancellation Note  Patient Details Name: Aaron Fitzgerald MRN: [id number] DOB: 1961/05/11   Cancelled Treatment:    Reason Eval/Treat Not Completed: Medical issues which prohibited therapy.   (See OT note re: discussion with RN).  PT to follow OT lead and will check back tomorrow.  Thanks,  Corinna Capra, PT, DPT  Acute Rehabilitation  [phone] pager #[phone] office      Lurena Joiner B Walton Digilio 04/19/2022, 1:27 PM

## 2022-04-19 NOTE — Progress Notes (Signed)
SLP Cancellation Note  Patient Details Name: Aaron Fitzgerald MRN: [id number] DOB: 04-Jan-1961   Cancelled treatment:       Reason Eval/Treat Not Completed: Medical issues which prohibited therapy;Other (comment) (patient intubated and sedated. SLP will follow for readiness)  Angela Nevin, MA, CCC-SLP Speech Therapy

## 2022-04-19 NOTE — Progress Notes (Signed)
NAME:  Aaron Fitzgerald, MRN:  [id number], DOB:  05-Feb-1961, LOS: 1 ADMISSION DATE:  04/28/2022, CONSULTATION DATE:  04/04/2022 REFERRING MD:  Dr Thomasena Edis- Neuro , CHIEF COMPLAINT:  encephalopathy   History of Present Illness:  Aaron Fitzgerald is a 61 y/o gentleman with a history of HTN treated with amlodipine who was found alert, non-verbal with right sided weakness in a church parking [place] in his [place]. Last known normal 2100 6/15. On arrival to ED patient is hypertensive with BP 214/144. On examination with global asphasia and right sided neglect and right side paralysis and noted facial drooling. NIHSS 22. Acute head with left frontal parenchymal hemorrhage involving basal ganglia with associated mild mass effect. ICH score 0. Neurology consulted. Started on Clevidipine for SBP 130-150, given 3% bolus and started on 75 cc/hr gtt with NA goal 150-155. Admitted to Neuro ICU. During stay with decreased mentation and inability to protect airway. Critical Care consulted.   Pertinent  Medical History  HTN Jehova's witness  Significant Hospital Events: Including procedures, antibiotic start and stop dates in addition to other pertinent events   6/16 Admitted for stroke 6/16 Intubation 6/16 CT head is unchanged with stable intraparenchymal hematoma in the left basal ganglia with surrounding edema, no change in mass effect  Interim History / Subjective:  Sedated No significant overnight events  Objective   Blood pressure (!) 163/80, pulse 98, temperature 98.5 F (36.9 C), temperature source Axillary, resp. rate (!) 28, height 5\' 6"  (1.676 m), weight 85 kg, SpO2 100 %.    Vent Mode: PRVC FiO2 (%):  [40 %-100 %] 40 % Set Rate:  [18 bmp] 18 bmp Vt Set:  [510 mL] 510 mL PEEP:  [5 cmH20] 5 cmH20 Plateau Pressure:  [15 cmH20-16 cmH20] 15 cmH20   Intake/Output Summary (Last 24 hours) at 04/19/2022 0848 Last data filed at 04/19/2022 0800 Gross per 24 hour  Intake 3068.19 ml  Output 950 ml  Net 2118.19  ml   Filed Weights   04/25/2022 1000  Weight: 85 kg    Examination: General: Middle-age, does not appear to be in distress, sedated HENT: Moist oral mucosa, endotracheal tube in place Lungs: Clear breath sounds bilaterally Cardiovascular: S1-S2 appreciated Abdomen: Soft, bowel sounds appreciated Extremities: No clubbing, no edema Neuro: Sedated, GU: Required catheterization  Resolved Hospital Problem list     Assessment & Plan:  Intraparenchymal hemorrhage in the left basal ganglia Associated midline shift -Goal blood pressure between 130 and 140 -Repeat CT scan was stable from previous -Appreciate neurosurgery follow-up, not deemed a surgical candidate at present -Continue neuroprotective measures  Concern with facial muscle twitching -EEG in place, no seizures reported -On empiric Keppra  Acute respiratory failure Inability to protect airway Significant aspiration risk -VAP prevention protocol -Continue ventilator support -Daily SAT and SBT as appropriate -Weaning per protocol  Clarified the patient is a 04/20/22 Witness and will not want blood products  At risk for malnutrition -Tube feeds started  Hypertensive emergency -Was on Norvasc -Currently on Cleviprex  Discussed with patient's friends who came in to visit him today, they were able to locate his family.  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  Best Practice (right click and "Reselect all SmartList Selections" daily)   Diet/type: tubefeeds DVT prophylaxis: SCD GI prophylaxis: PPI Lines: N/A Foley:  N/A Code Status:  full code Last date of multidisciplinary goals of care discussion [pending]  Labs   CBC: Recent Labs  Lab 04/15/2022 1040 04/28/2022 1047 04/25/2022 1542 04/19/22 0512  WBC  10.5  --   --  9.8  NEUTROABS 8.8*  --   --   --   HGB 16.5 18.7* 17.7* 13.3  HCT 53.2* 55.0* 52.0 42.6  MCV 73.7*  --   --  73.6*  PLT 147*  --   --  122*    Basic Metabolic Panel: Recent Labs  Lab 04/24/2022 1040  04/20/2022 1047 04/07/2022 1238 05/01/2022 1542 04/20/2022 1811 04/25/2022 2338 04/19/22 0512  NA 138 138 140 142 141 144 146*  K 3.5 3.4*  --  3.6  --   --  3.9  CL 103 103  --   --   --   --  118*  CO2 19*  --   --   --   --   --  20*  GLUCOSE 130* 128*  --   --   --   --  140*  BUN 12 14  --   --   --   --  15  CREATININE 1.20 1.00  --   --   --   --  1.24  CALCIUM 9.5  --   --   --   --   --  8.2*  MG  --   --   --   --   --   --  2.3  PHOS  --   --   --   --   --   --  2.2*   GFR: Estimated Creatinine Clearance: 64 mL/min (by C-G formula based on SCr of 1.24 mg/dL). Recent Labs  Lab 04/22/2022 1040 05/01/2022 1519 04/19/22 0512  WBC 10.5  --  9.8  LATICACIDVEN  --  1.3  --     Liver Function Tests: Recent Labs  Lab 04/05/2022 1040  AST 24  ALT 23  ALKPHOS 86  BILITOT 0.9  PROT 7.9  ALBUMIN 4.1   No results for input(s): "LIPASE", "AMYLASE" in the last 168 hours. No results for input(s): "AMMONIA" in the last 168 hours.  ABG    Component Value Date/Time   PHART 7.353 04/19/2022 1542   PCO2ART 46.2 04/23/2022 1542   PO2ART 365 (H) 04/04/2022 1542   HCO3 25.7 04/29/2022 1542   TCO2 27 04/12/2022 1542   O2SAT 100 04/17/2022 1542     Coagulation Profile: Recent Labs  Lab 04/10/2022 1040  INR 1.0    Cardiac Enzymes: No results for input(s): "CKTOTAL", "CKMB", "CKMBINDEX", "TROPONINI" in the last 168 hours.  HbA1C: Hgb A1c MFr Bld  Date/Time Value Ref Range Status  04/10/2022 12:25 PM 5.8 (H) 4.8 - 5.6 % Final    Comment:    (NOTE) Pre diabetes:          5.7%-6.4%  Diabetes:              >6.4%  Glycemic control for   <7.0% adults with diabetes     CBG: Recent Labs  Lab 04/15/2022 1617 04/27/2022 2012 04/28/2022 2350 04/19/22 0341 04/19/22 0735  GLUCAP 141* 132* 182* 162* 160*    Review of Systems:   Unobtainable  Past Medical History:  He,  has a past medical history of Allergy, HTN (hypertension), and Refusal of blood transfusions as patient is  Jehovah's Witness.   Surgical History:   Past Surgical History:  Procedure Laterality Date   HERNIA REPAIR       Social History:   reports that he has never smoked. He does not have any smokeless tobacco history on file. He reports current  alcohol use. He reports that he does not use drugs.   Family History:  His family history is not on file.   Allergies Allergies  Allergen Reactions   Plasma, Human     Jehova's witness   Red Blood Cells     Jehova's witness   Whole Blood     Jehova's witness     Home Medications  Prior to Admission medications   Medication Sig Start Date End Date Taking? Authorizing Provider  amLODipine (NORVASC) 10 MG tablet Take 1 tablet (10 mg total) by mouth daily. 03/25/13   Orma Flaming, MD    The patient is critically ill with multiple organ systems failure and requires high complexity decision making for assessment and support, frequent evaluation and titration of therapies, application of advanced monitoring technologies and extensive interpretation of multiple databases. Critical Care Time devoted to patient care services described in this note independent of APP/resident time (if applicable)  is 32 minutes.   Sherrilyn Rist MD [facility] Personal pager: See Amion If unanswered, please page CCM On-call: [phone]

## 2022-04-19 NOTE — Progress Notes (Signed)
Patient ID: Aaron Fitzgerald, male   DOB: 11/26/1960, 61 y.o.   MRN: [id number] Patient remains intubated Pupils are small When sedation is dropped he will move his left side in the upper and lower extremities He is not following any commands Last CT shows his hematoma to be stable We will continue to observe clinically

## 2022-04-20 ENCOUNTER — Inpatient Hospital Stay (HOSPITAL_COMMUNITY): Payer: Self-pay

## 2022-04-20 DIAGNOSIS — J9622 Acute and chronic respiratory failure with hypercapnia: Secondary | ICD-10-CM

## 2022-04-20 DIAGNOSIS — J9621 Acute and chronic respiratory failure with hypoxia: Secondary | ICD-10-CM

## 2022-04-20 LAB — GLUCOSE, CAPILLARY
Glucose-Capillary: 165 mg/dL — ABNORMAL HIGH (ref 70–99)
Glucose-Capillary: 132 mg/dL — ABNORMAL HIGH (ref 70–99)
Glucose-Capillary: 155 mg/dL — ABNORMAL HIGH (ref 70–99)
Glucose-Capillary: 142 mg/dL — ABNORMAL HIGH (ref 70–99)
Glucose-Capillary: 161 mg/dL — ABNORMAL HIGH (ref 70–99)

## 2022-04-20 LAB — BASIC METABOLIC PANEL
Anion gap: 6 (ref 5–15)
BUN: 11 mg/dL (ref 8–23)
CO2: 20 mmol/L — ABNORMAL LOW (ref 22–32)
Calcium: 8.2 mg/dL — ABNORMAL LOW (ref 8.9–10.3)
Chloride: 125 mmol/L — ABNORMAL HIGH (ref 98–111)
Creatinine, Ser: 1.23 mg/dL (ref 0.61–1.24)
GFR, Estimated: >60 mL/min (ref >60)
Glucose, Bld: 150 mg/dL — ABNORMAL HIGH (ref 70–99)
Potassium: 3.4 mmol/L — ABNORMAL LOW (ref 3.5–5.1)
Sodium: 151 mmol/L — ABNORMAL HIGH (ref 135–145)

## 2022-04-20 LAB — MAGNESIUM: Magnesium: 2.3 mg/dL (ref 1.7–2.4)

## 2022-04-20 LAB — CBC
HCT: 41.8 % (ref 39.0–52.0)
Hemoglobin: 13.1 g/dL (ref 13.0–17.0)
MCH: 23.4 pg — ABNORMAL LOW (ref 26.0–34.0)
MCHC: 31.3 g/dL (ref 30.0–36.0)
MCV: 74.5 fL — ABNORMAL LOW (ref 80.0–100.0)
Platelets: 94 10*3/uL — ABNORMAL LOW (ref 150–400)
RBC: 5.61 MIL/uL (ref 4.22–5.81)
RDW: 15.9 % — ABNORMAL HIGH (ref 11.5–15.5)
WBC: 11.2 10*3/uL — ABNORMAL HIGH (ref 4.0–10.5)
nRBC: 0 % (ref 0.0–0.2)

## 2022-04-20 LAB — PHOSPHORUS: Phosphorus: 2.3 mg/dL — ABNORMAL LOW (ref 2.5–4.6)

## 2022-04-20 LAB — SODIUM
Sodium: 154 mmol/L — ABNORMAL HIGH (ref 135–145)
Sodium: 150 mmol/L — ABNORMAL HIGH (ref 135–145)
Sodium: 153 mmol/L — ABNORMAL HIGH (ref 135–145)

## 2022-04-20 MED ORDER — HYDRALAZINE HCL 20 MG/ML IJ SOLN
20.0000 mg | Freq: Four times a day (QID) | INTRAMUSCULAR | Status: DC | PRN
  Administered 2022-04-21: 20 mg via INTRAVENOUS
  Filled 2022-04-20: qty 1

## 2022-04-20 MED ORDER — POTASSIUM PHOSPHATES 15 MMOLE/5ML IV SOLN
15.0000 mmol | Freq: Once | INTRAVENOUS | Status: AC
  Administered 2022-04-20: 15 mmol via INTRAVENOUS
  Filled 2022-04-20: qty 5

## 2022-04-20 MED ORDER — LABETALOL HCL 5 MG/ML IV SOLN
20.0000 mg | INTRAVENOUS | Status: DC | PRN
Start: 2022-04-20 — End: 2022-04-21
  Administered 2022-04-20 – 2022-04-21 (×4): 20 mg via INTRAVENOUS
  Filled 2022-04-20 (×3): qty 4

## 2022-04-20 MED ORDER — POTASSIUM CHLORIDE 20 MEQ PO PACK
20.0000 meq | PACK | Freq: Once | ORAL | Status: AC
  Administered 2022-04-20: 20 meq
  Filled 2022-04-20: qty 1

## 2022-04-20 MED ORDER — CARVEDILOL 3.125 MG PO TABS
6.2500 mg | ORAL_TABLET | Freq: Two times a day (BID) | ORAL | Status: DC
Start: 2022-04-20 — End: 2022-04-21
  Administered 2022-04-20 (×2): 6.25 mg via NASOGASTRIC
  Filled 2022-04-20 (×2): qty 2

## 2022-04-20 NOTE — Procedures (Addendum)
Patient Name: Aaron Fitzgerald  MRN: [id number]  Epilepsy Attending: Charlsie Quest  Referring Physician/Provider: Marvel Plan, MD  Duration: 04/19/2022 1556 to 04/20/2022 1204   Patient history:  61 y.o. 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 EEG to evaluate for seizure   Level of alertness: lethargic    AEDs during EEG study: LEV, propofol   Technical aspects: This EEG study was done with scalp electrodes positioned according to the 10-20 International system of electrode placement. Electrical activity was acquired at a sampling rate of 500Hz  and reviewed with a high frequency filter of 70Hz  and a low frequency filter of 1Hz . EEG data were recorded continuously and digitally stored.    Description: EEG showed continuous generalized and lateralized left hemisphere 3 to 6 Hz theta-delta slowing admixed with an excessive amount of 15 to 18 Hz beta activity distributed symmetrically and diffusely. Hyperventilation and photic stimulation were not performed.      ABNORMALITY - Continuous slow, generalized and lateralized left hemisphere   IMPRESSION:  This study is suggestive of cortical dysfunction in left hemisphere likely due to underlying hemorrhage. There is also moderate to severe diffuse encephalopathy, nonspecific etiology but likely related to sedation. No seizures or epileptiform discharges were seen throughout the recording.  EEG appears to  be similar to previous day.   Daemyn Gariepy 

## 2022-04-20 NOTE — Progress Notes (Signed)
OT Cancellation Note  Patient Details Name: Aaron Fitzgerald MRN: [id number] DOB: 09-23-1961   Cancelled Treatment:    Reason Eval/Treat Not Completed: Patient not medically ready (Pt continues to be intubated and sedated; will attempt eval when appropriate.)  Aldridge Krzyzanowski A Lamont Tant 04/20/2022, 11:11 AM

## 2022-04-20 NOTE — Progress Notes (Signed)
Vent Pt transported from 4N32 to MRI and back without any complications.

## 2022-04-20 NOTE — Progress Notes (Addendum)
STROKE TEAM PROGRESS NOTE   INTERVAL HISTORY He is intubated and On Cleviprex @ 16mg , Propofolof @ , 3% saline@ 50cc/hr. Resting comfortable. [employer] Family members are at the bedside, as well as POA. Last sodium 151. Does not open eyes, Does not follow commands. Bilateral corneal reflex absent. Pinpoint pupils. No blink to threat, cough/gag present. Withdraws on left, flaccid on the right. LTM still in place with severe diffuse encephalopathy, no seizures identified. Will discontinue LTM today. MRI brain ordered. Added lisinopril 40 mg and cored 6.25mg  to help with weaning off Cleviprex drip. [employer] family members updated at the bedside.   Vitals:   04/20/22 0630 04/20/22 0700 04/20/22 0715 04/20/22 0807  BP: 137/85 (!) 144/86 (!) 139/91   Pulse: 93 96 98 85  Resp: (!) 22 (!) 23 18 (!) 24  Temp:   (!) 100.7 F (38.2 C)   TempSrc:   Axillary   SpO2: 99% 99% 99% 99%  Weight:      Height:       CBC:  Recent Labs  Lab 04/15/2022 1040 04/26/2022 1047 04/19/22 0512 04/20/22 0417  WBC 10.5  --  9.8 11.2*  NEUTROABS 8.8*  --   --   --   HGB 16.5   < > 13.3 13.1  HCT 53.2*   < > 42.6 41.8  MCV 73.7*  --  73.6* 74.5*  PLT 147*  --  122* 94*   < > = values in this interval not displayed.    Basic Metabolic Panel:  Recent Labs  Lab 04/19/22 0512 04/19/22 0954 04/19/22 2156 04/20/22 0417  NA 146*   < > 154* 151*  K 3.9  --   --  3.4*  CL 118*  --   --  125*  CO2 20*  --   --  20*  GLUCOSE 140*  --   --  150*  BUN 15  --   --  11  CREATININE 1.24  --   --  1.23  CALCIUM 8.2*  --   --  8.2*  MG 2.3  --   --  2.3  PHOS 2.2*  --   --  2.3*   < > = values in this interval not displayed.    Lipid Panel:  Recent Labs  Lab 04/06/2022 1238 04/19/22 0512  CHOL 201*  --   TRIG 130 73  HDL 63  --   CHOLHDL 3.2  --   VLDL 26  --   LDLCALC 112*  --     HgbA1c:  Recent Labs  Lab 04/23/2022 1225  HGBA1C 5.8*    Urine Drug Screen:  Recent Labs  Lab 04/19/22 0954  LABOPIA  NONE DETECTED  COCAINSCRNUR NONE DETECTED  LABBENZ POSITIVE*  AMPHETMU NONE DETECTED  THCU NONE DETECTED  LABBARB NONE DETECTED    Alcohol Level  Recent Labs  Lab 04/23/2022 1040  ETH <10     IMAGING past 24 hours DG Abd Portable 1V  Result Date: 04/19/2022 CLINICAL DATA:  61 year old male status post orogastric tube placement. EXAM: PORTABLE ABDOMEN - 1 VIEW COMPARISON:  No priors. FINDINGS: Tip of orogastric tube is in the mid stomach. Visualized bowel-gas pattern appears nonobstructive. No definite pneumoperitoneum noted on this single view examination. Lower abdomen and pelvis are incompletely imaged. IMPRESSION: 1. Tip of enteric tube is in the mid stomach. Electronically Signed   By: 76 M.D.   On: 04/19/2022 10:38   Overnight EEG with video  Result Date: 04/19/2022  Charlsie Quest, MD     04/19/2022 10:36 AM Patient Name: Aaron Fitzgerald MRN: [id number] Epilepsy Attending: Charlsie Quest Referring Physician/Provider: Marvel Plan, MD Duration: 04/30/2022 1556 to 04/19/2022 1035  Patient history:  61 y.o. 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 EEG to evaluate for seizure  Level of alertness: lethargic  AEDs during EEG study: LEV, propofol  Technical aspects: This EEG study was done with scalp electrodes positioned according to the 10-20 International system of electrode placement. Electrical activity was acquired at a sampling rate of 500Hz  and reviewed with a high frequency filter of 70Hz  and a low frequency filter of 1Hz . EEG data were recorded continuously and digitally stored.  Description: EEG showed continuous generalized and lateralized left hemisphere 3 to 6 Hz theta-delta slowing admixed with an excessive amount of 15 to 18 Hz beta activity distributed symmetrically and diffusely. Hyperventilation and photic stimulation were not performed.     ABNORMALITY - Continuous slow, generalized and lateralized left hemisphere  IMPRESSION: This study is suggestive of cortical dysfunction in left hemisphere likely due to underlying hemorrhage. There is also moderate to severe diffuse encephalopathy, nonspecific etiology but likely related to sedation. No seizures or epileptiform discharges were seen throughout the recording.  Priyanka     PHYSICAL EXAM General: intubated and sedated male in NAD. Afebrile. Head is nontraumatic. Neck is supple without bruit.    Cardiac exam no murmur or gallop. Lungs are clear to auscultation. Distal pulses are well felt.  Neurological Exam :  sedated and intubated.  Eyes are closed.  Does not follow commands, does not respond to noxious stimuli. Pupils pinpoint with minimal reactive. Corneals absent bilateral, cough/gag present. Withdraws on left, Right side flaccid  ASSESSMENT/PLAN [name] is a 61 y.o. male with history of HTN treated with amlodipine who was found alert, non-verbal with right sided weakness in [place] parking [place] in his [place]. Last known normal 2100 6/15. He was hypertensive in the ED BP 214/144. NIHSS 22. patient is a 76 Witness and will not want blood products  Stroke: Acute left BG hemorrhage with cerebral edema likely due to uncontrolled hypertension  Code Stroke CT head Acute left frontal parenchymal hemorrhage involving basal ganglia and surrounding white matter. Associated edema with mild mass effect.  CTA head & neck Medium and small vessel irregularity involving both anterior and posterior circulations. There is no aneurysm or AVM. Repeat Ct head - Unchanged size of ICH and midline shift MRI pending 2D Echo EF 55-60%.  No shunt UDS + benzos.  UA negative LDL 112 HgbA1c 5.8 VTE prophylaxis - SCD's No antithrombotic prior to admission, now on No antithrombotic.  Therapy recommendations:  pending  Disposition:  pending  Cerebral Edema  Iatrogenic  Hypernatremia  On HTS @ 85->50cc/hr  Neurosuregery on board Last Na 151 Received 250cc bolus of 3% 6/17 Serial NA checks  Seizure like activity Reported facial twitching on admission LTM EEG suggestive of cortical dysfunction in left hemisphere likely due to underlying hemorrhage.  No seizure Continue Keppra DC long-term EEG  Hypertensive Emergency  Home meds:  amlodipine 10mg  bid -restarted  UnStable On clevipres, wean off as able  Continue hydralazine 100mg  Q8hr and HCTZ 25mg  daily  Add lisinopril 40mg  daily and coreg 6.39md BID  SBP goal < 160 PRN labetalol and hydralazine to maintain BP goal  Long-term BP goal normotensive  Hyperlipidemia  Home meds:  none  LDL 112, goal < 70 Consider adding statin at discharge   Acute Respiratory failure due to inability to protect airway VAP prevention protocol  propofol for sedation SBT daily  CXR/ ABG as needs   Dysphagia  NG tube TF advancing to goal   Leukocytosis  WBC 11.2 will monitor afebrile   Other Stroke Risk Factors Obesity, Body mass index is 30.25 kg/m., BMI >/= 30 associated with increased stroke risk, recommend weight loss, diet and exercise as appropriate   Other Active Problems [facility] day # 2  Gevena Mart DNP, ACNPC-AG   ATTENDING NOTE: I reviewed above note and agree with the assessment and plan. Pt was seen and examined.   61 year old male with history of hypertension, Jehovah's Witness admitted for aphasia, right-sided weakness and significant elevated BP.  CT showed right BG ICH.  CTA head and neck no AVM, intracranial atherosclerosis.  CT repeat stable hematoma and midline shift.  MRI pending.  Sodium 151 today.  Creatinine 1.24.  EF 55 to 60%.  UDS positive for benzo.  LDL 112, A1c 5.2.  LTM EEG no seizure.  On exam, patient intubated on heavy sedation, eyes closed, not following commands. With forced eye opening, eyes in mid position, not blinking to visual threat, doll's  eyes absent, not tracking, bilateral pupil pinpoint. Corneal reflex , gag and cough present. Breathing not over the vent.  Facial symmetry not able to test due to ET tube.  Tongue protrusion not cooperative. On pain stimulation, flicker movement on the left upper and lower extremities. DTR diminished and no babinski. Sensation, coordination and gait not tested.  Etiology for patient ICH likely due to uncontrolled hypertension.  Pending MRI to further evaluate cerebral edema today.  So far NSG on board no intervention needed.  LTM EEG no seizure, will DC LTM to have MRI today.  Continue 3% saline, based on MRI result to decide whether need 23.4% saline bolus.  Continue further BP control, taper off Cleviprex as able, started on p.o. BP meds, currently on 4 BP meds.  Vent management per CCM, continue tube feeding.  Long discussion with patient primary contact, patient has healthcare POA to help him make decisions.  Will need social worker and case manager in a.m. for follow-up.  For detailed assessment and plan, please refer to above/below as I have made changes wherever appropriate.   Marvel Plan, MD PhD Stroke Neurology 04/20/2022 2:50 PM  This patient is critically ill due to left BG ICH, cerebral edema, hypertensive emergency, seizure, respiratory failure, dysarthria and at significant risk of neurological worsening, death form hematoma expansion, brain herniation, hypertensive encephalopathy, status epilepticus, aspiration, sepsis. This patient's care requires constant monitoring of vital signs, hemodynamics, respiratory and cardiac monitoring, review of multiple databases, neurological assessment, discussion with family, other specialists and medical decision making of high complexity. I spent 45 minutes of neurocritical care time in the care of this patient. I had long discussion with patient primary contact at bedside, updated pt current condition, treatment plan and potential prognosis, and  answered all the questions.  He expressed understanding and appreciation.      To contact Stroke Continuity provider, please refer to [email or web address]. After hours, contact General Neurology

## 2022-04-20 NOTE — Progress Notes (Signed)
PT Cancellation Note  Patient Details Name: Aaron Fitzgerald MRN: [id number] DOB: June 23, 1961   Cancelled Treatment:    Reason Eval/Treat Not Completed: Medical issues which prohibited therapy Discussed with RN; pt still intubated and sedated; not following commands.  Lillia Pauls, PT, DPT Acute Rehabilitation Services Office [phone]    Norval Morton 04/20/2022, 10:54 AM

## 2022-04-20 NOTE — Progress Notes (Addendum)
NAME:  Aaron Fitzgerald, MRN:  [id number], DOB:  1961-01-13, LOS: 2 ADMISSION DATE:  04/09/2022, CONSULTATION DATE:  04/17/2022 REFERRING MD:  Dr Theda Sers- Neuro , CHIEF COMPLAINT:  encephalopathy   History of Present Illness:  Aaron Fitzgerald is a 61 y/o gentleman with a history of HTN treated with amlodipine who was found alert, non-verbal with right sided weakness in a church parking [place] in his [place]. Last known normal 2100 6/15. On arrival to ED patient is hypertensive with BP 214/144. On examination with global asphasia and right sided neglect and right side paralysis and noted facial drooling. NIHSS 22. Acute head with left frontal parenchymal hemorrhage involving basal ganglia with associated mild mass effect. ICH score 0. Neurology consulted. Started on Clevidipine for SBP 130-150, given 3% bolus and started on 75 cc/hr gtt with NA goal 150-155. Admitted to Neuro ICU. During stay with decreased mentation and inability to protect airway. Critical Care consulted.   Pertinent  Medical History  HTN Jehova's witness  Significant Hospital Events: Including procedures, antibiotic start and stop dates in addition to other pertinent events   6/16 Admitted for stroke 6/16 Intubation 6/16 CT head is unchanged with stable intraparenchymal hematoma in the left basal ganglia with surrounding edema, no change in mass effect  Interim History / Subjective:  Still require sedation, antihypertensive No significant overnight events No change in overall status Objective   Blood pressure 134/83, pulse 85, temperature (!) 100.7 F (38.2 C), temperature source Axillary, resp. rate (!) 23, height 5\' 6"  (1.676 m), weight 85 kg, SpO2 98 %.    Vent Mode: PRVC FiO2 (%):  [40 %] 40 % Set Rate:  [18 bmp] 18 bmp Vt Set:  [510 mL] 510 mL PEEP:  [5 cmH20] 5 cmH20 Plateau Pressure:  [13 cmH20-15 cmH20] 15 cmH20   Intake/Output Summary (Last 24 hours) at 04/20/2022 0957 Last data filed at 04/20/2022 0800 Gross per 24  hour  Intake 5810.08 ml  Output 1775 ml  Net 4035.08 ml   Filed Weights   04/10/2022 1000 04/20/22 0422  Weight: 85 kg 85 kg    Examination: General: Middle-age, does not appear to be in distress  HENT: Moist oral mucosa, endotracheal tube in place Lungs: Clear breath sounds bilaterally Cardiovascular: S1-S2 appreciated Abdomen: Soft, bowel sounds appreciated Extremities: No clubbing, no edema Neuro: Sedated, does have a cough, does have a gag GU: Required catheterization  Resolved Hospital Problem list     Assessment & Plan:   Intraparenchymal hemorrhage in the left basal ganglia Associated midline shift -Goal blood pressure between 130 and 140 -CT head is stable -Appreciate neurosurgery follow-up -Continue neuroprotective measures  Concern with facial muscle twitching -EEG not showing any seizures or epileptiform discharges -Continue empiric Keppra  Acute respiratory failure Inability to protect airway Aspiration risk -Weaning per protocol -VAP prevention protocol in place  At risk of malnutrition -Started on tube feeds  Hypertensive emergency -Was on Norvasc -Currently on Cleviprex -On hydralazine 100 p.o. 3 times daily, amlodipine 10 mg daily, lisinopril 40 daily  Discussed with patient's friends who came in to visit him on 6/17, they were able to locate his family.  Family is located in [place]  Best Practice (right click and "Reselect all SmartList Selections" daily)   Diet/type: tubefeeds DVT prophylaxis: SCD GI prophylaxis: PPI Lines: N/A Foley:  N/A Code Status:  full code Last date of multidisciplinary goals of care discussion [pending]  Labs   CBC: Recent Labs  Lab 05/01/2022 1040 04/03/2022 1047 04/13/2022  1542 04/19/22 0512 04/20/22 0417  WBC 10.5  --   --  9.8 11.2*  NEUTROABS 8.8*  --   --   --   --   HGB 16.5 18.7* 17.7* 13.3 13.1  HCT 53.2* 55.0* 52.0 42.6 41.8  MCV 73.7*  --   --  73.6* 74.5*  PLT 147*  --   --  122* 94*    Basic  Metabolic Panel: Recent Labs  Lab 04-20-2022 1040 04/20/22 1047 Apr 20, 2022 1238 20-Apr-2022 1542 04/20/22 1811 04/19/22 0512 04/19/22 0954 04/19/22 1446 04/19/22 1745 04/19/22 2156 04/20/22 0417  NA 138 138   < > 142   < > 146* 152* 152* 152* 154* 151*  K 3.5 3.4*  --  3.6  --  3.9  --   --   --   --  3.4*  CL 103 103  --   --   --  118*  --   --   --   --  125*  CO2 19*  --   --   --   --  20*  --   --   --   --  20*  GLUCOSE 130* 128*  --   --   --  140*  --   --   --   --  150*  BUN 12 14  --   --   --  15  --   --   --   --  11  CREATININE 1.20 1.00  --   --   --  1.24  --   --   --   --  1.23  CALCIUM 9.5  --   --   --   --  8.2*  --   --   --   --  8.2*  MG  --   --   --   --   --  2.3  --   --   --   --  2.3  PHOS  --   --   --   --   --  2.2*  --   --   --   --  2.3*   < > = values in this interval not displayed.   GFR: Estimated Creatinine Clearance: 64.5 mL/min (by C-G formula based on SCr of 1.23 mg/dL). Recent Labs  Lab 2022/04/20 1040 04-20-2022 1519 04/19/22 0512 04/20/22 0417  WBC 10.5  --  9.8 11.2*  LATICACIDVEN  --  1.3  --   --     Liver Function Tests: Recent Labs  Lab 04/20/22 1040  AST 24  ALT 23  ALKPHOS 86  BILITOT 0.9  PROT 7.9  ALBUMIN 4.1   No results for input(s): "LIPASE", "AMYLASE" in the last 168 hours. No results for input(s): "AMMONIA" in the last 168 hours.  ABG    Component Value Date/Time   PHART 7.353 04/20/22 1542   PCO2ART 46.2 April 20, 2022 1542   PO2ART 365 (H) 04-20-22 1542   HCO3 25.7 Apr 20, 2022 1542   TCO2 27 04-20-2022 1542   O2SAT 100 2022/04/20 1542     Coagulation Profile: Recent Labs  Lab 2022/04/20 1040  INR 1.0    Cardiac Enzymes: No results for input(s): "CKTOTAL", "CKMB", "CKMBINDEX", "TROPONINI" in the last 168 hours.  HbA1C: Hgb A1c MFr Bld  Date/Time Value Ref Range Status  04/20/22 12:25 PM 5.8 (H) 4.8 - 5.6 % Final    Comment:    (NOTE) Pre diabetes:  5.7%-6.4%  Diabetes:               >6.4%  Glycemic control for   <7.0% adults with diabetes     CBG: Recent Labs  Lab 04/19/22 1525 04/19/22 1945 04/19/22 2343 04/20/22 0337 04/20/22 0741  GLUCAP 131* 158* 152* 165* 132*    Review of Systems:   Unobtainable  Past Medical History:  He,  has a past medical history of Allergy, HTN (hypertension), and Refusal of blood transfusions as patient is Jehovah's Witness.   Surgical History:   Past Surgical History:  Procedure Laterality Date   HERNIA REPAIR       Social History:   reports that he has never smoked. He does not have any smokeless tobacco history on file. He reports current alcohol use. He reports that he does not use drugs.   Family History:  His family history is not on file.   Allergies Allergies  Allergen Reactions   Plasma, Human     Jehova's witness   Red Blood Cells     Jehova's witness   Whole Blood     Jehova's witness     Home Medications  Prior to Admission medications   Medication Sig Start Date End Date Taking? Authorizing Provider  amLODipine (NORVASC) 10 MG tablet Take 1 tablet (10 mg total) by mouth daily. 03/25/13   Jonita Albee, MD    The patient is critically ill with multiple organ systems failure and requires high complexity decision making for assessment and support, frequent evaluation and titration of therapies, application of advanced monitoring technologies and extensive interpretation of multiple databases. Critical Care Time devoted to patient care services described in this note independent of APP/resident time (if applicable)  is 31 minutes.   Virl Diamond MD [facility] Personal pager: See Amion If unanswered, please page CCM On-call: #[phone]

## 2022-04-20 NOTE — Progress Notes (Signed)
Patient ID: Aaron Fitzgerald, male   DOB: [birth year], 61 y.o.   MRN: [id number] Patient remains intubated hemiplegic but not following commands when sedation is lessened.  Aaron Fitzgerald EEG monitoring is concluded today and he will likely be able to get Aaron Fitzgerald MRI this evening.  Pupils remain small left gaze deviation still noted.  Continue supportive care and observation for now

## 2022-04-20 NOTE — Progress Notes (Signed)
[facility] ADULT ICU REPLACEMENT PROTOCOL   The patient does apply for the [facility] Adult ICU Electrolyte Replacment Protocol based on the criteria listed below:   1.Exclusion criteria: TCTS patients, ECMO patients, and Dialysis patients 2. Is GFR >/= 30 ml/min? Yes.    Patient's GFR today is >60 3. Is SCr </= 2? Yes.   Patient's SCr is 1.23 mg/dL 4. Did SCr increase >/= 0.5 in 24 hours? No. 5.Pt's weight >40kg  Yes.   6. Abnormal electrolyte(s): Phos, K  7. Electrolytes replaced per protocol 8.  Call MD STAT for K+ </= 2.5, Phos </= 1, or Mag </= 1 Physician:  Leona Singleton Stevie Charter 04/20/2022 6:23 AM

## 2022-04-20 NOTE — Progress Notes (Signed)
LTM EEG discontinued - no skin breakdown at unhook.   

## 2022-04-20 NOTE — Progress Notes (Signed)
Per vent check, ETT position was changed. Found on left and moved without difficulty to right side. I was able to open teeth to assist transition which was more difficult to do last night. However, it was noted 2 lower teeth on left front side are loose and appear dislodged but not out. RN notified.

## 2022-04-21 DIAGNOSIS — Z66 Do not resuscitate: Secondary | ICD-10-CM

## 2022-04-21 DIAGNOSIS — Z515 Encounter for palliative care: Secondary | ICD-10-CM

## 2022-04-21 DIAGNOSIS — R6520 Severe sepsis without septic shock: Secondary | ICD-10-CM

## 2022-04-21 DIAGNOSIS — Z7189 Other specified counseling: Secondary | ICD-10-CM

## 2022-04-21 DIAGNOSIS — I161 Hypertensive emergency: Secondary | ICD-10-CM

## 2022-04-21 LAB — BASIC METABOLIC PANEL
Anion gap: 6 (ref 5–15)
BUN: 13 mg/dL (ref 8–23)
CO2: 20 mmol/L — ABNORMAL LOW (ref 22–32)
Calcium: 8.1 mg/dL — ABNORMAL LOW (ref 8.9–10.3)
Chloride: 124 mmol/L — ABNORMAL HIGH (ref 98–111)
Creatinine, Ser: 1.39 mg/dL — ABNORMAL HIGH (ref 0.61–1.24)
GFR, Estimated: 58 mL/min — ABNORMAL LOW (ref >60)
Glucose, Bld: 172 mg/dL — ABNORMAL HIGH (ref 70–99)
Potassium: 3.2 mmol/L — ABNORMAL LOW (ref 3.5–5.1)
Sodium: 150 mmol/L — ABNORMAL HIGH (ref 135–145)

## 2022-04-21 LAB — CBC
HCT: 39.9 % (ref 39.0–52.0)
Hemoglobin: 12.3 g/dL — ABNORMAL LOW (ref 13.0–17.0)
MCH: 22.8 pg — ABNORMAL LOW (ref 26.0–34.0)
MCHC: 30.8 g/dL (ref 30.0–36.0)
MCV: 73.9 fL — ABNORMAL LOW (ref 80.0–100.0)
Platelets: 83 10*3/uL — ABNORMAL LOW (ref 150–400)
RBC: 5.4 MIL/uL (ref 4.22–5.81)
RDW: 15.9 % — ABNORMAL HIGH (ref 11.5–15.5)
WBC: 14.2 10*3/uL — ABNORMAL HIGH (ref 4.0–10.5)
nRBC: 0 % (ref 0.0–0.2)

## 2022-04-21 LAB — MAGNESIUM: Magnesium: 2.2 mg/dL (ref 1.7–2.4)

## 2022-04-21 LAB — GLUCOSE, CAPILLARY
Glucose-Capillary: 143 mg/dL — ABNORMAL HIGH (ref 70–99)
Glucose-Capillary: 155 mg/dL — ABNORMAL HIGH (ref 70–99)
Glucose-Capillary: 157 mg/dL — ABNORMAL HIGH (ref 70–99)
Glucose-Capillary: 187 mg/dL — ABNORMAL HIGH (ref 70–99)

## 2022-04-21 LAB — SODIUM: Sodium: 154 mmol/L — ABNORMAL HIGH (ref 135–145)

## 2022-04-21 LAB — PHOSPHORUS: Phosphorus: 3.6 mg/dL (ref 2.5–4.6)

## 2022-04-21 MED ORDER — MIDAZOLAM-SODIUM CHLORIDE 100-0.9 MG/100ML-% IV SOLN
0.0000 mg/h | INTRAVENOUS | Status: DC
  Administered 2022-04-21: 1 mg/h via INTRAVENOUS
  Filled 2022-04-21: qty 100

## 2022-04-21 MED ORDER — GLYCOPYRROLATE 0.2 MG/ML IJ SOLN: 0.2000 mg | INTRAMUSCULAR | Status: DC | PRN

## 2022-04-21 MED ORDER — MORPHINE 100MG IN NS 100ML (1MG/ML) PREMIX INFUSION
0.0000 mg/h | INTRAVENOUS | Status: DC
  Administered 2022-04-21: 5 mg/h via INTRAVENOUS
  Filled 2022-04-21: qty 100

## 2022-04-21 MED ORDER — DIPHENHYDRAMINE HCL 50 MG/ML IJ SOLN: 25.0000 mg | INTRAMUSCULAR | Status: DC | PRN

## 2022-04-21 MED ORDER — MIDAZOLAM BOLUS VIA INFUSION (WITHDRAWAL LIFE SUSTAINING TX)
2.0000 mg | INTRAVENOUS | Status: DC | PRN
  Administered 2022-04-21: 1 mg via INTRAVENOUS

## 2022-04-21 MED ORDER — POLYVINYL ALCOHOL 1.4 % OP SOLN: 1.0000 [drp] | Freq: Four times a day (QID) | OPHTHALMIC | Status: DC | PRN

## 2022-04-21 MED ORDER — CARVEDILOL 12.5 MG PO TABS
25.0000 mg | ORAL_TABLET | Freq: Two times a day (BID) | ORAL | Status: DC
Start: 2022-04-21 — End: 2022-04-21
  Administered 2022-04-21: 25 mg via NASOGASTRIC
  Filled 2022-04-21: qty 2

## 2022-04-21 MED ORDER — GLYCOPYRROLATE 1 MG PO TABS: 1.0000 mg | ORAL_TABLET | ORAL | Status: DC | PRN

## 2022-04-21 MED ORDER — CLONIDINE HCL 0.1 MG PO TABS
0.1000 mg | ORAL_TABLET | Freq: Two times a day (BID) | ORAL | Status: DC
  Administered 2022-04-21: 0.1 mg
  Filled 2022-04-21: qty 1

## 2022-04-21 MED ORDER — POTASSIUM CHLORIDE 10 MEQ/50ML IV SOLN
10.0000 meq | INTRAVENOUS | Status: AC
  Administered 2022-04-21 (×4): 10 meq via INTRAVENOUS
  Filled 2022-04-21 (×4): qty 50

## 2022-04-21 MED ORDER — MORPHINE BOLUS VIA INFUSION
5.0000 mg | INTRAVENOUS | Status: DC | PRN
  Administered 2022-04-21 (×5): 5 mg via INTRAVENOUS

## 2022-04-21 MED ORDER — POTASSIUM CHLORIDE 20 MEQ PO PACK
20.0000 meq | PACK | ORAL | Status: AC
  Administered 2022-04-21: 20 meq
  Filled 2022-04-21: qty 1

## 2022-04-21 MED ORDER — MORPHINE SULFATE (PF) 2 MG/ML IV SOLN: 2.0000 mg | INTRAVENOUS | Status: DC | PRN

## 2022-04-21 MED ORDER — MIDAZOLAM HCL 2 MG/2ML IJ SOLN
2.0000 mg | INTRAMUSCULAR | Status: DC | PRN
  Administered 2022-04-21: 2 mg via INTRAVENOUS
  Filled 2022-04-21: qty 4

## 2022-05-03 NOTE — Assessment & Plan Note (Addendum)
Remains on clevidipine in spite of multiple enteral agents.  Likely exacerbated by salt loading from 3% Na now at 154  - Stop 3% now as minimal edema and unlikely to have additional benefit from higher Na  - May benefit from diuresis thereafter - Minoxidil as last resort BP agent.

## 2022-05-03 NOTE — Progress Notes (Addendum)
Patient [id number] Aaron Fitzgerald      DOB: [birth year]      EPP:[id number]      Palliative Medicine Team    Subjective: Bedside symptom check completed. Two friends, Aaron Fitzgerald and Aaron Fitzgerald, bedside at time of visit. Patient asked to see as priority by bedside RN Junious Dresser.    Physical exam: Patient resting in bed with eyes closed at time of visit. Breathing even and non-labored with ventilator support, some thick oral secretions noted. Patient without physical or non-verbal signs of pain or discomfort at this time. Patient not interactive, noted to be off of IV sedation at this time. This RN did not attempt to further stimulate patient.   Assessment and plan: NP Elijio Miles to follow up with friends bedside and continue conversation. Aaron Fitzgerald and Aaron Fitzgerald shared that they are part of his church family. They told me the story of how we got to this point. They echo the importance of taking care of yourself and seeing a doctor even if you look "healthy". They share he was vibrant and full of life, playing kickball with the young members of his church recently. This RN offered chaplain support, they declined, they have a great support system in place. Aaron Fitzgerald shared that he recently went through a similar situation last week with a family member and is all too familiar with the ICU. He and Aaron Fitzgerald are both very understanding of the gravity of the situation and wish for Aaron Fitzgerald's suffering to be over. They did have questions regarding how to best handle the day to day details after support is withdrawn, this RN reached out to social work and case management to help navigate external affairs following withdrawal. Bedside RN Junious Dresser kept in the loop with evolving plan of care. Will continue to follow for any changes or advances.    Thank you for allowing the Palliative Medicine Team to assist in the care of this patient.     Shelda Jakes, MSN, RN Palliative Medicine Team Team Phone: [phone]  This phone is monitored 7a-7p,  please reach out to attending physician outside of these hours for urgent needs.

## 2022-05-03 NOTE — Progress Notes (Addendum)
NAME:  Aaron Fitzgerald, MRN:  [id number], DOB:  03/21/1961, LOS: 3 ADMISSION DATE:  May 18, 2022, CONSULTATION DATE:  05/18/22 REFERRING MD: Thomasena Edis - Neuro, CHIEF COMPLAINT:  Encephalopathy   History of Present Illness:  61 year old man who presented to [facility] 6/16 after being found alert but non-verbal with R-sided weakness in his car in a church parking lot. LKN 2100 6/15. PMHx significant for HTN. Of note, patient is a Scientist, product/process development.  On arrival to ED, noted to be hypertensive with BP 214/144. On examination, global asphasia noted as well as R-sided neglect, R-sided paralysis and drooling. NIHSS 22. CT Head demonstrated left frontal parenchymal hemorrhage involving basal ganglia with associated mild mass effect (ICH score 0). Neurology consulted. Clevidipine initiated for BP control and HTS 3% bolus given; started on gtt at 53mL/hr. Admitted to Neuro ICU.   Patient was noted to have decreased mentation and inability to protect airway, prompting PCCM consult.  Pertinent Medical History:  HTN Jehovah's Witness  Significant Hospital Events: Including procedures, antibiotic start and stop dates in addition to other pertinent events   6/16 Admitted for stroke 6/16 Intubation 6/16 CT Head unchanged with stable intraparenchymal hematoma in the left basal ganglia with surrounding edema, no change in mass effect 6/18 MRI with multiple punctate subcortical acute infarcts bilaterally; ?central embolic source; stable L basal ganglia hemorrhage; punctate foci of susceptibility throughout basal ganglia bilaterally/both cerebellar hemispheres (vasculitis vs. septic emboli?) remote lacunar infarcts of thalami. LTM discontinued. 6/19 Vent wean attempted, tachypneic to 30s and trial aborted.  Interim History / Subjective:  Hypertensive overnight to SBP 170s despite multiple medications/Cleviprex Clonidine added per E-Link MD RRT attempted vent wean this AM Tachypneic to 30s and trial  aborted  Objective   Blood pressure 118/74, pulse 76, temperature 99.2 F (37.3 C), temperature source Axillary, resp. rate (!) 25, height 5\' 6"  (1.676 m), weight 84.2 kg, SpO2 99 %.    Vent Mode: PRVC FiO2 (%):  [40 %] 40 % Set Rate:  [18 bmp] 18 bmp Vt Set:  [510 mL] 510 mL PEEP:  [5 cmH20] 5 cmH20 Plateau Pressure:  [15 cmH20] 15 cmH20   Intake/Output Summary (Last 24 hours) at 04/06/2022 0731 Last data filed at 04/10/2022 0600 Gross per 24 hour  Intake 3522.06 ml  Output 1825 ml  Net 1697.06 ml    Filed Weights   05-18-22 1000 04/20/22 0422 04/13/2022 0431  Weight: 85 kg 85 kg 84.2 kg   Physical Examination: General: Acutely ill-appearing middle-aged man in NAD. HEENT: [place]/AT, anicteric sclera, PERRL, moist mucous membranes. ETT/OGT in place. Neuro: Sedated. Withdraws to pain in LUE/LLE. Not following commands. Unilateral R-sided neglect noted.  +Cough and +Gag  CV: RRR, no m/g/r. PULM: Breathing even and unlabored on vent (PEEP 5, FiO2 40%). Lung fields CTAB. GI: Soft, nontender, nondistended. Normoactive bowel sounds. Extremities: No significant LE edema noted. Skin: Warm/dry, no rashes.  Resolved Hospital Problem List:     Assessment & Plan:   Intraparenchymal hemorrhage in the left basal ganglia Associated midline shift MRI 6/18 with multiple punctate subcortical acute infarcts bilaterally; ?central embolic source; stable L basal ganglia hemorrhage; punctate foci of susceptibility throughout basal ganglia bilaterally/both cerebellar hemispheres (vasculitis vs. septic emboli?) remote lacunar infarcts of thalami. - Neuro/Stroke primary - Goal SBP 130-150 - Cleviprex titrated to SBP goal - Continue VT antihypertensive agents - CT Head stable, MRI 6/18 as above - HTS 3% per Neuro, goal Na 150-155 - Frequent neurochecks - Neuroprotective measures: HOB > 30 degrees, normoglycemia, normothermia,  electrolytes WNL - Obtain TEE, given c/f central embolic source (r/o  infectious endocarditis)  Concern with facial muscle twitching S/p LTM without evidence of epileptiform discharges, discontinued 6/18. - Continue Keppra  Acute hypoxemic respiratory failure Inability to protect airway Aspiration risk - Continue full vent support (4-8cc/kg IBW) - Wean FiO2 for O2 sat > 90% - Daily WUA/SBT, currently precluded by mental status/tachypnea (attempted 6/19AM) - VAP bundle - Pulmonary hygiene - PAD protocol for sedation: Propofol and Fentanyl for goal RASS 0 to -1  At risk of malnutrition - Nutrition/RD following, appreciate recommendations - Continue TF  Hypertensive emergency Home regimen: Norvasc - Goal SBP 130-150 - Continue Cleviprex titrated to SBP goal  -Continue VT agents, maximize as able to facilitate Cleviprex weaning - Consider diuresis as tolerated, complex given HTS 3%  AKI secondary to severe hypertension - Trend BMP - Replete electrolytes as indicated - Monitor I&Os - Avoid nephrotoxic agents as able  Best Practice: (right click and "Reselect all SmartList Selections" daily)   Diet/type: tubefeeds DVT prophylaxis: SCD GI prophylaxis: PPI Lines: N/A Foley:  N/A Code Status:  full code Last date of multidisciplinary goals of care discussion [Pending, discussed with patient's friends who came in to visit him on 6/17, they were able to locate his family.  Family is located in [place]]  Critical care time: 81 minutes   Tim Lair, [place] [facility] 04/27/2022 7:33 AM  Please see [email or web address] for pager details.  From 7A-7P if no response, please call [phone] After hours, please call ELink [phone]

## 2022-05-03 NOTE — Progress Notes (Signed)
Pt extubated to comfort care per order. 

## 2022-05-03 NOTE — Progress Notes (Signed)
OT Cancellation Note  Patient Details Name: Aaron Fitzgerald MRN: [id number] DOB: October 06, 1961   Cancelled Treatment:    Reason Eval/Treat Not Completed: Medical issues which prohibited therapy (Continue efforts as appropriate.)  Tanzie Rothschild A Baldomero Mirarchi 21-May-2022, 9:07 AM

## 2022-05-03 NOTE — Progress Notes (Signed)
CSW received request to speak to speak with patient's POA regarding support. CSW spoke with Minerva Areola and emailed him resources to contact the Registers of Deeds and the [place] of Courts ([email or web address]).  Joaquin Courts, MSW, [facility]

## 2022-05-03 NOTE — Consult Note (Incomplete)
Palliative Care Consult Note                                  Date: 2022/05/08   Patient Name: Aaron Fitzgerald  DOB: 1961/07/14  MRN: [id number]  Age / Sex: 61 y.o., male  PCP: Pcp, No Referring Physician: Stroke, Md, MD  Reason for Consultation: Establishing goals of care  HPI/Patient Profile: 61 y.o. male  with past medical history of hypertension who presented to [facility] ED on 04/03/2022 after being found alert but non-verbal in his car in a church parking [place]. Also with right-sided weakness. In the ED, he was hypertensive with BP 214/144. CT head showed left frontal parenchymal hemorrhage involving basal ganglia with associated mild mass effect (ICH score 0). Clevidipine initiated for BP control. Patient was intubated due to decreased mentation and inability to protect airway. Admitted to Neuro ICU.     Past Medical History:  Diagnosis Date   Allergy    HTN (hypertension)    Refusal of blood transfusions as patient is Jehovah's Witness     Subjective:   I have reviewed medical records including EPIC notes, labs and imaging, received report from the team, and assessed the patient at bedside. He remains on clevidipine gtt. Note that vent weaning was attempted this morning, but patient became tachypneic to 30s and trial was aborted.  He withdraws to pain on the left side.  He is not following commands.  I met with patient's friends Randall Hiss and Sam at bedside to discuss diagnosis, prognosis, GOC, EOL wishes, disposition, and options. Randall Hiss and Sam are also [employer]; they belong to the same church as the patient. They explain that in their church, they assist with making sure all their members have advanced directives in place. Randall Hiss is the patient's health care agent.   I introduced Palliative Medicine as specialized medical care for people living with serious illness. It focuses on providing relief from the symptoms and stress of a  serious illness.   Created space and opportunity for Randall Hiss and Sam to explore thoughts and feelings regarding current medical situation. Values and goals of care important to patient and family were attempted to be elicited.  Life Review: Vondell was born in [place]. He immigrated to the [place] 30+ years ago. His brother is deceased and his sister is critically ill in [place]. His sister's son (patient's nephew) also lives in [place] but has been estranged from [place] for many years. He has no other family.   Functional Status: Livan lived alone in his apartment in [place]. He worked full-time as a Education officer, museum. His friends describe him as active and a "free-spirit".   Discussion: We discussed patient's current illness and what it means in the larger context of her ongoing co-morbidities. Discussed the natural disease trajectory of a sudden, severe neurological injury such as intracranial hemorrhage. Discussed the low probability for meaningful recovery and that Jaxn would not return to his previous baseline.    Randall Hiss and Sam know that Judah would not want to live in a debilitated  functional state. They do not feel this would be good quality of life for him.  The difference between full scope medical intervention and comfort care was considered. Reviewed the concept of comfort care, which means stopping full scope medical interventions and allowing a natural course to occur. Discussed that the goal is comfort and dignity rather than cure/prolonging life.  Discussed that  comfort care would mean removing ventilator support, stopping infusion for BP control,  no labs, and no artificial hydration or feeding. Discussed that we would   Randall Hiss and Sam confirm that they wish to proceed with removal of ventilator support and transition to comfort care today. They do not wish to be present for this. They plan to say their goodbyes and then leave.   Patient's belongings have been retrieved from  security and released to Randall Hiss and Sam. They have additional questions about obtaining death certificate, and how they should handle patient's apartment and belongings. We have reached out to CSW for support.   15:10 - I received a page from patient's bedside RN stating that patient is not comfortable despite morphine infusion at maximum rate, several bolus doses of morphine, and a dose of prn versed. I have ordered a versed drip.     Review of Systems  Unable to perform ROS   Objective:   Primary Diagnoses: Present on Admission:  ICH (intracerebral hemorrhage) ([place])   Physical Exam Vitals reviewed.  Constitutional:      General: He is not in acute distress.    Appearance: He is ill-appearing.  Cardiovascular:     Rate and Rhythm: Normal rate.  Pulmonary:     Comments: Intubated/vent Neurological:     Comments: Withdraws to pain LUE/LLE Not following commands Right-sided neglect     Vital Signs:  BP 135/80   Pulse 83   Temp 99.4 F (37.4 C) (Axillary)   Resp (!) 24   Ht $R'5\' 6"'Ry$  (1.676 m)   Wt 84.2 kg   SpO2 100%   BMI 29.96 kg/m   Palliative Assessment/Data: PPS 10%     Assessment & Plan:   SUMMARY OF RECOMMENDATIONS   Transition to full comfort care Code status changed to DNR Stop tube feeding and clevidipine Extubate per PCCM ventilator withdraw order set   Symptom Management:  Morphine infusion for pain and air hunger Versed infusion for sedation Glycopyrrolate (ROBINUL) for excessive secretions Polyvinyl alcohol (LIQUIFILM TEARS) prn for dry eyes  Primary Decision Maker: Carrolyn Leigh [facility])  Prognosis:  Hours - Days  Discharge Planning:  Anticipated Hospital Death    Thank you for allowing [place] to participate in the care of Mikel Cella  MDM - High  Signed by: Elie Confer, NP Palliative Medicine Team  Team Phone # [phone]  For individual providers, please see AMION

## 2022-05-03 NOTE — Progress Notes (Signed)
eLink Physician-Brief Progress Note Patient Name: Aaron Fitzgerald DOB: [birth year] MRN: [id number]   Date of Service  2022-04-26  HPI/Events of Note  Notified of hypertension with BP 176/76 despite being on 6 anti-hypertensives including cleviprex gtt at max dose, amlodipine 10, hydralazine 100mg  TID, HCTz 25, lisinopril 40, carvedilol. Pt given hydralazine and labetalol PRN.   Pt is on propofol and appears comfortable.   eICU Interventions  Maximize carvedilol to 25mg  BID.  Add clonidine this morning.      Intervention Category Intermediate Interventions: Hypertension - evaluation and management  04-26-2022, 3:05 AM

## 2022-05-03 NOTE — Progress Notes (Signed)
STROKE TEAM PROGRESS NOTE   INTERVAL HISTORY He remains intubated and On Cleviprex and, Propofolof drips as well as 3% saline@ 50cc/hr. Resting comfortable. [employer] Family members are at the bedside, as well as POA. Last sodium 154.  Remains comatose with eyes closed does not open eyes, Does not follow commands. Bilateral corneal reflex absent. Pinpoint pupils. No blink to threat, cough/gag present. Withdraws on left, flaccid on the right.  [employer] family members updated at the bedside.  MRI done yesterday shows stable appearance of the large left basal ganglia hemorrhage and in addition there are multiple tiny punctate subcortical infarcts bilaterally likely from small vessel disease.  There are also remote lacunar infarct in both thalami Vitals:   06-20-22 1152 06-20-22 1200 06-20-22 1215 06-20-22 1230  BP:  135/80    Pulse: 90 86 84 83  Resp: (!) 26 (!) 25 (!) 25 (!) 24  Temp: 99.4 F (37.4 C)     TempSrc: Axillary     SpO2: 100% 100% 100% 100%  Weight:      Height:       CBC:  Recent Labs  Lab 04/11/2022 1040 05/01/2022 1047 04/20/22 0417 06-20-22 0540  WBC 10.5   < > 11.2* 14.2*  NEUTROABS 8.8*  --   --   --   HGB 16.5   < > 13.1 12.3*  HCT 53.2*   < > 41.8 39.9  MCV 73.7*   < > 74.5* 73.9*  PLT 147*   < > 94* 83*   < > = values in this interval not displayed.   Basic Metabolic Panel:  Recent Labs  Lab 04/20/22 0417 04/20/22 1032 06-20-22 0540 06-20-22 0943  NA 151*   < > 150* 154*  K 3.4*  --  3.2*  --   CL 125*  --  124*  --   CO2 20*  --  20*  --   GLUCOSE 150*  --  172*  --   BUN 11  --  13  --   CREATININE 1.23  --  1.39*  --   CALCIUM 8.2*  --  8.1*  --   MG 2.3  --  2.2  --   PHOS 2.3*  --  3.6  --    < > = values in this interval not displayed.   Lipid Panel:  Recent Labs  Lab 04/20/2022 1238 04/19/22 0512  CHOL 201*  --   TRIG 130 73  HDL 63  --   CHOLHDL 3.2  --   VLDL 26  --   LDLCALC 112*  --    HgbA1c:  Recent Labs  Lab 04/20/2022 1225   HGBA1C 5.8*   Urine Drug Screen:  Recent Labs  Lab 04/19/22 0954  LABOPIA NONE DETECTED  COCAINSCRNUR NONE DETECTED  LABBENZ POSITIVE*  AMPHETMU NONE DETECTED  THCU NONE DETECTED  LABBARB NONE DETECTED    Alcohol Level  Recent Labs  Lab 04/12/2022 1040  ETH <10    IMAGING past 24 hours MR BRAIN WO CONTRAST  Result Date: 04/20/2022 CLINICAL DATA:  Intracranial hemorrhage.  Stroke, hemorrhagic. EXAM: MRI HEAD WITHOUT CONTRAST TECHNIQUE: Multiplanar, multiecho pulse sequences of the brain and surrounding structures were obtained without intravenous contrast. COMPARISON:  CT head without contrast 04/16/2022 FINDINGS: Brain: Multiple punctate subcortical acute infarcts are present bilaterally. Punctate cortical infarcts are present in the posteroinferior right occipital lobe. The left basal ganglia hemorrhage is stable in size, measuring 4.1 x 3.6 x 5.0 cm. No underlying lesion  is present. Punctate foci of susceptibility are present throughout the basal ganglia bilaterally and within both cerebellar hemispheres. Extensive confluent periventricular T2 hyperintensities are present bilaterally. Dilated perivascular spaces are present in the basal ganglia bilaterally. Remote lacunar infarcts are present in the thalami. The brainstem and cerebellum are otherwise unremarkable. Vascular: Flow is present in the major intracranial arteries. Skull and upper cervical spine: The craniocervical junction is normal. Upper cervical spine is within normal limits. Marrow signal is unremarkable. Sinuses/Orbits: Diffuse mucosal thickening is present throughout the paranasal sinuses. Small bilateral mastoid effusions are present. Fluid is present within the nasopharynx. Orogastric tube noted. IMPRESSION: 1. Multiple punctate subcortical acute infarcts bilaterally. Question central embolic source. Question infectious endocarditis. 2. Stable appearance of left basal ganglia hemorrhage. 3. Punctate foci of susceptibility  throughout the basal ganglia bilaterally and within both cerebellar hemispheres. This could be related to underlying vasculitis or septic emboli. 4. Remote lacunar infarcts of the thalami. 5. Diffuse sinus disease. 6. Small bilateral mastoid effusions. Fluid in the nasopharynx is likely secondary to intubation. Electronically Signed   By: Marin Roberts M.D.   On: 04/20/2022 16:27   ECHOCARDIOGRAM COMPLETE  Result Date: 04/20/2022    ECHOCARDIOGRAM REPORT   Patient Name:   Aaron Fitzgerald Date of Exam: 04/16/2022 Medical Rec #:  [id number]        Height:       66.0 in Accession #:    [id number]       Weight:       187.4 lb Date of Birth:  05/30/1961       BSA:          1.945 m Patient Age:    61 years         BP:           161/106 mmHg Patient Gender: M                HR:           75 bpm. Exam Location:  Inpatient Procedure: 2D Echo, 3D Echo, Cardiac Doppler, Color Doppler and Strain Analysis Indications:    Stroke I63.9  History:        Patient has no prior history of Echocardiogram examinations.                 Stroke; Risk Factors:Hypertension. ICH.  Sonographer:    Sheralyn Boatman RDCS Referring Phys: [id number] [facility]  Sonographer Comments: Suboptimal parasternal window and echo performed with patient supine and on artificial respirator. Global longitudinal strain was attempted. IMPRESSIONS  1. Left ventricular ejection fraction, by estimation, is 55 to 60%. The left ventricle has normal function. The left ventricle has no regional wall motion abnormalities. There is severe left ventricular hypertrophy. Left ventricular diastolic parameters  are consistent with Grade I diastolic dysfunction (impaired relaxation). Elevated left ventricular end-diastolic pressure. The average left ventricular global longitudinal strain is -18.5 %. The global longitudinal strain is normal.  2. Right ventricular systolic function is normal. The right ventricular size is normal. There is normal pulmonary artery systolic  pressure.  3. The pericardial effusion is posterior to the left ventricle.  4. The mitral valve is abnormal. No evidence of mitral valve regurgitation. No evidence of mitral stenosis.  5. The aortic valve is tricuspid. There is mild calcification of the aortic valve. Aortic valve regurgitation is mild. Aortic valve sclerosis is present, with no evidence of aortic valve stenosis.  6. The inferior vena cava is normal in size with greater  than 50% respiratory variability, suggesting right atrial pressure of 3 mmHg. FINDINGS  Left Ventricle: Left ventricular ejection fraction, by estimation, is 55 to 60%. The left ventricle has normal function. The left ventricle has no regional wall motion abnormalities. The average left ventricular global longitudinal strain is -18.5 %. The global longitudinal strain is normal. The left ventricular internal cavity size was normal in size. There is severe left ventricular hypertrophy. Left ventricular diastolic parameters are consistent with Grade I diastolic dysfunction (impaired relaxation). Elevated left ventricular end-diastolic pressure. Right Ventricle: The right ventricular size is normal. No increase in right ventricular wall thickness. Right ventricular systolic function is normal. There is normal pulmonary artery systolic pressure. The tricuspid regurgitant velocity is 1.91 m/s, and  with an assumed right atrial pressure of 3 mmHg, the estimated right ventricular systolic pressure is 17.6 mmHg. Left Atrium: Left atrial size was normal in size. Right Atrium: Right atrial size was normal in size. Pericardium: Trivial pericardial effusion is present. The pericardial effusion is posterior to the left ventricle. Mitral Valve: The mitral valve is abnormal. There is mild thickening of the mitral valve leaflet(s). There is mild calcification of the mitral valve leaflet(s). No evidence of mitral valve regurgitation. No evidence of mitral valve stenosis. MV peak gradient, 8.1 mmHg. The  mean mitral valve gradient is 3.0 mmHg. Tricuspid Valve: The tricuspid valve is normal in structure. Tricuspid valve regurgitation is not demonstrated. No evidence of tricuspid stenosis. Aortic Valve: The aortic valve is tricuspid. There is mild calcification of the aortic valve. Aortic valve regurgitation is mild. Aortic regurgitation PHT measures 576 msec. Aortic valve sclerosis is present, with no evidence of aortic valve stenosis. Pulmonic Valve: The pulmonic valve was normal in structure. Pulmonic valve regurgitation is not visualized. No evidence of pulmonic stenosis. Aorta: The aortic root is normal in size and structure. Venous: The inferior vena cava is normal in size with greater than 50% respiratory variability, suggesting right atrial pressure of 3 mmHg. IAS/Shunts: No atrial level shunt detected by color flow Doppler.  LEFT VENTRICLE PLAX 2D LVIDd:         4.10 cm     Diastology LVIDs:         2.50 cm     LV e' medial:    3.60 cm/s LV PW:         1.90 cm     LV E/e' medial:  19.8 LV IVS:        2.00 cm     LV e' lateral:   4.35 cm/s LVOT diam:     2.40 cm     LV E/e' lateral: 16.4 LV SV:         106 LV SV Index:   55          2D Longitudinal Strain LVOT Area:     4.52 cm    2D Strain GLS Avg:     -18.5 %  LV Volumes (MOD) LV vol d, MOD A4C: 81.4 ml 3D Volume EF: LV vol s, MOD A4C: 31.4 ml 3D EF:        59 % LV SV MOD A4C:     81.4 ml LV EDV:       123 ml                            LV ESV:       50 ml  LV SV:        73 ml RIGHT VENTRICLE             IVC RV S prime:     12.80 cm/s  IVC diam: 1.80 cm TAPSE (M-mode): 2.0 cm LEFT ATRIUM             Index       RIGHT ATRIUM           Index LA diam:        2.20 cm 1.13 cm/m  RA Area:     13.80 cm LA Vol (A2C):   15.7 ml 8.07 ml/m  RA Volume:   27.00 ml  13.88 ml/m LA Vol (A4C):   15.2 ml 7.81 ml/m LA Biplane Vol: 15.5 ml 7.97 ml/m  AORTIC VALVE LVOT Vmax:   133.00 cm/s LVOT Vmean:  79.100 cm/s LVOT VTI:    0.235 m AI PHT:       576 msec  AORTA Ao Root diam: 4.20 cm MITRAL VALVE                TRICUSPID VALVE MV Area (PHT): 3.27 cm     TR Peak grad:   14.6 mmHg MV Area VTI:   3.18 cm     TR Vmax:        191.00 cm/s MV Peak grad:  8.1 mmHg MV Mean grad:  3.0 mmHg     SHUNTS MV Vmax:       1.42 m/s     Systemic VTI:  0.24 m MV Vmean:      81.3 cm/s    Systemic Diam: 2.40 cm MV Decel Time: 232 msec MV E velocity: 71.30 cm/s MV A velocity: 117.00 cm/s MV E/A ratio:  0.61 Charlton Haws MD Electronically signed by Charlton Haws MD Signature Date/Time: 04/15/2022/3:15:51 PM    Final     PHYSICAL EXAM General: intubated and sedated male in NAD. Marland Kitchen Afebrile. Head is nontraumatic. Neck is supple without bruit.    Cardiac exam no murmur or gallop. Lungs are clear to auscultation. Distal pulses are well felt.  Neurological Exam :  sedated and intubated.  Eyes are closed.  Does not follow commands, does not respond to noxious stimuli. Pupils pinpoint with minimal reactive. Corneals absent bilateral, cough/gag present. Withdraws on left, Right side flaccid  ASSESSMENT/PLAN Aaron Fitzgerald is a 61 y.o. male with history of HTN treated with amlodipine who was found alert, non-verbal with right sided weakness in a church parking [place] in his car. Last known normal 2100 6/15. He was hypertensive in the ED BP 214/144. NIHSS 22. 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  MRI also shows bilateral small punctate subcortical infarcts likely from small vessel disease Code Stroke CT head Acute left frontal parenchymal hemorrhage involving basal ganglia and surrounding white matter. Associated edema with mild mass effect.  CTA head & neck Medium and small vessel irregularity involving both anterior and posterior circulations. There is no aneurysm or AVM. Repeat Ct head - Unchanged size of ICH and midline shift MRI pending 2D Echo EF  55-60%.  No shunt UDS + benzos.  UA negative LDL 112 HgbA1c 5.8 VTE prophylaxis - SCD's No antithrombotic prior to admission, now on No antithrombotic.  Therapy recommendations:  pending  Disposition:  pending  Cerebral Edema  Iatrogenic Hypernatremia  On HTS @ 85->50cc/hr  Neurosuregery on  board Last Na 151 Received 250cc bolus of 3% 6/17 Serial NA checks  Seizure like activity Reported facial twitching on admission LTM EEG suggestive of cortical dysfunction in left hemisphere likely due to underlying hemorrhage.  No seizure Continue Keppra DC long-term EEG  Hypertensive Emergency  Home meds:  amlodipine 10mg  bid -restarted  UnStable On clevipres, wean off as able  Continue hydralazine 100mg  Q8hr and HCTZ 25mg  daily  Add lisinopril 40mg  daily and coreg 6.70md BID  SBP goal < 160 PRN labetalol and hydralazine to maintain BP goal  Long-term BP goal normotensive  Hyperlipidemia Home meds:  none  LDL 112, goal < 70 Consider adding statin at discharge   Acute Respiratory failure due to inability to protect airway VAP prevention protocol  propofol for sedation SBT daily  CXR/ ABG as needs   Dysphagia  NG tube TF advancing to goal   Leukocytosis  WBC 11.2 will monitor afebrile   Other Stroke Risk Factors Obesity, Body mass index is 29.96 kg/m., BMI >/= 30 associated with increased stroke risk, recommend weight loss, diet and exercise as appropriate   Other Active Problems [facility] day # 3    Patient neurological exam remains quite poor due to large hemorrhage and cytotoxic edema and small by cerebral infarcts likely from small vessel disease.  Is unlikely to survive this and make meaningful recovery and live independently.  Will likely need prolonged ventilator support, tracheostomy, PEG tube and nursing home care.  I had a long discussion with patient and her power of attorney at the bedside and answered questions.  He feels patient would  not have wanted to live her life of such major disability and he will contact his family and, can likely make decision about comfort care and withdrawal of care soon.  Discussed with Dr. critical care medicine  This patient is critically ill due to left BG ICH, cerebral edema, hypertensive emergency, seizure, respiratory failure, dysarthria and at significant risk of neurological worsening, death form hematoma expansion, brain herniation, hypertensive encephalopathy, status epilepticus, aspiration, sepsis. This patient's care requires constant monitoring of vital signs, hemodynamics, respiratory and cardiac monitoring, review of multiple databases, neurological assessment, discussion with family, other specialists and medical decision making of high complexity. I spent 35 minutes of neurocritical care time in the care of this patient. I had long discussion with patient primary contact at bedside, updated pt current condition, treatment plan and potential prognosis, and answered all the questions.  He expressed understanding and appreciation.   , MD   To contact Stroke Continuity provider, please refer to 32m. After hours, contact General Neurology

## 2022-05-03 NOTE — Assessment & Plan Note (Addendum)
ICH score initially 0.  Intubated for airway protection.   - Stop 3%.

## 2022-05-03 NOTE — Progress Notes (Signed)
Patient valuables, that were check in to security on admission, were signed out by and given to pt's HCPOA, Aaron Fitzgerald. Aaron Fitzgerald and I were witnesses to exchange.  Pt's other friend, Aaron Fitzgerald, was present. Aaron Fitzgerald

## 2022-05-03 NOTE — Progress Notes (Signed)
PT Cancellation Note  Patient Details Name: Aaron Fitzgerald MRN: [id number] DOB: 1961-04-15   Cancelled Treatment:    Reason Eval/Treat Not Completed: Medical issues which prohibited therapy remain this morning (elevated BP despite medications, intubated and sedated). Will continue to follow and evaluate as appropriate.   Vickki Muff, PT, DPT   Acute Rehabilitation Department   Aaron Fitzgerald 04/10/2022, 9:15 AM

## 2022-05-03 DEATH — deceased
